# Patient Record
Sex: Male | Born: 1990
Health system: Southern US, Community
[De-identification: ages and names within clinical notes are randomized; demographics above are authoritative.]

## PROBLEM LIST (undated history)

## (undated) HISTORY — PX: HIP SURGERY: SHX245

## (undated) HISTORY — PX: SHOULDER OPEN ROTATOR CUFF REPAIR: SHX2407

## (undated) HISTORY — PX: RECONSTRUCTION THUMB, ULNAR COLLATERAL LIGAMENT: SHX510877

## (undated) HISTORY — PX: KNEE SURGERY: SHX244

---

## 2012-02-25 ENCOUNTER — Encounter (HOSPITAL_COMMUNITY): Payer: Self-pay | Admitting: Emergency Medicine

## 2012-02-25 ENCOUNTER — Emergency Department (HOSPITAL_COMMUNITY)
Admission: EM | Admit: 2012-02-25 | Discharge: 2012-02-25 | Disposition: A | Discharge status: Home / Self Care | Payer: BC Managed Care – PPO | Attending: Emergency Medicine | Admitting: Emergency Medicine

## 2012-02-25 ENCOUNTER — Emergency Department (HOSPITAL_COMMUNITY): Payer: BC Managed Care – PPO

## 2012-02-25 DIAGNOSIS — F17200 Nicotine dependence, unspecified, uncomplicated: Secondary | ICD-10-CM | POA: Insufficient documentation

## 2012-02-25 DIAGNOSIS — R1013 Epigastric pain: Secondary | ICD-10-CM | POA: Insufficient documentation

## 2012-02-25 DIAGNOSIS — R109 Unspecified abdominal pain: Secondary | ICD-10-CM

## 2012-02-25 LAB — LIPASE, BLOOD: Lipase: 29 U/L (ref 11–59)

## 2012-02-25 LAB — COMPREHENSIVE METABOLIC PANEL
ALT: 24 U/L (ref 0–53)
AST: 19 U/L (ref 0–37)
Albumin: 4.7 g/dL (ref 3.5–5.2)
Alkaline Phosphatase: 65 U/L (ref 39–117)
BUN: 13 mg/dL (ref 6–23)
CO2: 25 mEq/L (ref 19–32)
Calcium: 10.3 mg/dL (ref 8.4–10.5)
Chloride: 101 mEq/L (ref 96–112)
Creatinine, Ser: 0.93 mg/dL (ref 0.50–1.35)
GFR calc Af Amer: >90 mL/min (ref >90)
GFR calc non Af Amer: >90 mL/min (ref >90)
Glucose, Bld: 92 mg/dL (ref 70–99)
Potassium: 4.1 mEq/L (ref 3.5–5.1)
Sodium: 139 mEq/L (ref 135–145)
Total Bilirubin: 0.3 mg/dL (ref 0.3–1.2)
Total Protein: 7.8 g/dL (ref 6.0–8.3)

## 2012-02-25 LAB — CBC WITH DIFFERENTIAL/PLATELET
Basophils Absolute: 0 10*3/uL (ref 0.0–0.1)
Basophils Relative: 0 % (ref 0–1)
Eosinophils Absolute: 0.3 10*3/uL (ref 0.0–0.7)
Eosinophils Relative: 3 % (ref 0–5)
HCT: 44.6 % (ref 39.0–52.0)
Hemoglobin: 15.7 g/dL (ref 13.0–17.0)
Lymphocytes Relative: 25 % (ref 12–46)
Lymphs Abs: 2.2 10*3/uL (ref 0.7–4.0)
MCH: 28.9 pg (ref 26.0–34.0)
MCHC: 35.2 g/dL (ref 30.0–36.0)
MCV: 82.1 fL (ref 78.0–100.0)
Monocytes Absolute: 0.7 10*3/uL (ref 0.1–1.0)
Monocytes Relative: 8 % (ref 3–12)
Neutro Abs: 5.5 10*3/uL (ref 1.7–7.7)
Neutrophils Relative %: 63 % (ref 43–77)
Platelets: 253 10*3/uL (ref 150–400)
RBC: 5.43 MIL/uL (ref 4.22–5.81)
RDW: 12.1 % (ref 11.5–15.5)
WBC: 8.7 10*3/uL (ref 4.0–10.5)

## 2012-02-25 MED ORDER — OMEPRAZOLE 20 MG PO CPDR: 40.0000 mg | DELAYED_RELEASE_CAPSULE | Freq: Every day | ORAL | Status: DC

## 2012-02-25 NOTE — ED Notes (Signed)
Pt c/o epigastric pain intermittent x 1 week; pt sts sometimes worse after eating; pt sts some nausea

## 2012-02-25 NOTE — ED Provider Notes (Signed)
History     CSN: [id number]  Arrival date & time 02/25/12  1039   First MD Initiated Contact with Patient 02/25/12 1106      Chief Complaint  Patient presents with  . Abdominal Pain    (Consider location/radiation/quality/duration/timing/severity/associated sxs/prior treatment) The history is provided by the patient.   patient presents with few day history of abdominal pain. It is epigastric. It is constant, but has some episodes of severity. He states this happened since she was sledding and hit his rear-ended broke his coccyx. He states his been on Vicodin for her. He states no relief with TUMS or Prilosec. It is worse after eating. He had a few alcoholic drinks recently but not too many. He's not had pains at this before. No blood in stool. No diarrhea. Cough. No fevers. No weight loss. History reviewed. No pertinent past medical history.  History reviewed. No pertinent past surgical history.  History reviewed. No pertinent family history.  History  Substance Use Topics  . Smoking status: Current Every Day Smoker  . Smokeless tobacco: Not on file  . Alcohol Use: No      Review of Systems  Constitutional: Negative for activity change and appetite change.  HENT: Negative for neck stiffness.   Eyes: Negative for pain.  Respiratory: Negative for chest tightness and shortness of breath.   Cardiovascular: Negative for chest pain and leg swelling.  Gastrointestinal: Positive for abdominal pain. Negative for nausea, vomiting and diarrhea.  Genitourinary: Negative for flank pain.  Musculoskeletal: Negative for back pain.  Skin: Negative for rash.  Neurological: Negative for weakness, numbness and headaches.  Psychiatric/Behavioral: Negative for behavioral problems.    Allergies  Review of patient's allergies indicates no known allergies.  Home Medications   Current Outpatient Rx  Name  Route  Sig  Dispense  Refill  . calcium carbonate (TUMS EX) 750 MG chewable tablet  Oral   Chew 2 tablets by mouth 3 (three) times daily as needed for heartburn.         Marland Kitchen omeprazole (PRILOSEC) 20 MG capsule   Oral   Take 2 capsules (40 mg total) by mouth daily.   14 capsule   0     BP 125/42  Pulse 80  Temp(Src) 98 F (36.7 C) (Oral)  Resp 20  SpO2 100%  Physical Exam  Nursing note and vitals reviewed. Constitutional: He is oriented to person, place, and time. He appears well-developed and well-nourished.  HENT:  Head: Normocephalic and atraumatic.  Eyes: EOM are normal. Pupils are equal, round, and reactive to light.  Neck: Normal range of motion. Neck supple.  Cardiovascular: Normal rate, regular rhythm and normal heart sounds.   No murmur heard. Pulmonary/Chest: Effort normal and breath sounds normal.  Abdominal: Soft. Bowel sounds are normal. He exhibits no distension and no mass. There is tenderness. There is no rebound and no guarding.  Epigastric tenderness without rebound or guarding. No rash. No hernias.  Musculoskeletal: Normal range of motion. He exhibits no edema.  Neurological: He is alert and oriented to person, place, and time. No cranial nerve deficit.  Skin: Skin is warm and dry.  Psychiatric: He has a normal mood and affect.    ED Course  Procedures (including critical care time)  Labs Reviewed  CBC WITH DIFFERENTIAL  COMPREHENSIVE METABOLIC PANEL  LIPASE, BLOOD   US Abdomen Complete  02/25/2012  *RADIOLOGY REPORT*  Clinical Data:  Epigastric abdominal pain  COMPLETE ABDOMINAL ULTRASOUND  Comparison:  None.  Findings:  Gallbladder:  Sonographically normal.  No echogenic gallstones or gall sludge.  No gallbladder wall thickening or pericholecystic fluid.  Negative sonographic Murphy's sign.  Common bile duct:  Normal in size measuring 2.4 mm in diameter  Liver:  Homogeneous hepatic echotexture.  No discrete hepatic lesions.  No definite evidence of intrahepatic biliary ductal dilatation.  No ascites.  IVC:  Appears normal.  Pancreas:   Limited visualization of the pancreatic head and neck is normal.  Visualization of the pancreatic body and tail is obscured by bowel gas.  Spleen:  Normal in size measuring 10.1 cm in length.  Right Kidney:  Normal cortical thickness, echogenicity and size, measuring 12.4 cm in length.  No focal renal lesions.  No echogenic renal stones.  No urinary obstruction.  Left Kidney:  Normal cortical thickness, echogenicity and size, measuring 13.0 cm in length.  No focal renal lesions.  No echogenic renal stones.  No urinary obstruction.  Abdominal aorta:  No aneurysm identified.  IMPRESSION: No explanation for patient's epigastric pain.  Specifically, no evidence of cholelithiasis or urinary obstruction.   Original Report Authenticated By: Tacey Ruiz, MD      1. Abdominal pain       MDM  Patient presents with upper abdominal pain. Worse with eating. Been going for a few days. Began after trauma, however he states is not his abdomen. He has minimal tenderness. Liver is reassuring. Patient be discharged home with proton pump inhibitor and will follow with gastroenterology.        Juliet Rude. Rubin Payor, MD 02/25/12 1454

## 2012-02-25 NOTE — ED Notes (Signed)
Pt returned to room from ultrasound, no distress noted. 

## 2012-07-22 ENCOUNTER — Encounter (HOSPITAL_BASED_OUTPATIENT_CLINIC_OR_DEPARTMENT_OTHER): Payer: Self-pay | Admitting: *Deleted

## 2012-07-22 ENCOUNTER — Ambulatory Visit (HOSPITAL_BASED_OUTPATIENT_CLINIC_OR_DEPARTMENT_OTHER)
Admission: RE | Admit: 2012-07-22 | Discharge: 2012-07-22 | Disposition: A | Discharge status: Home / Self Care | Payer: Worker's Compensation | Source: Ambulatory Visit | Attending: Orthopedic Surgery | Admitting: Orthopedic Surgery

## 2012-07-22 ENCOUNTER — Ambulatory Visit (HOSPITAL_BASED_OUTPATIENT_CLINIC_OR_DEPARTMENT_OTHER): Payer: Worker's Compensation | Admitting: Anesthesiology

## 2012-07-22 ENCOUNTER — Encounter (HOSPITAL_BASED_OUTPATIENT_CLINIC_OR_DEPARTMENT_OTHER): Payer: Self-pay | Admitting: Anesthesiology

## 2012-07-22 ENCOUNTER — Encounter (HOSPITAL_BASED_OUTPATIENT_CLINIC_OR_DEPARTMENT_OTHER)
Admission: RE | Disposition: A | Discharge status: Home / Self Care | Payer: Self-pay | Source: Ambulatory Visit | Attending: Orthopedic Surgery

## 2012-07-22 DIAGNOSIS — Y9269 Other specified industrial and construction area as the place of occurrence of the external cause: Secondary | ICD-10-CM | POA: Insufficient documentation

## 2012-07-22 DIAGNOSIS — F17200 Nicotine dependence, unspecified, uncomplicated: Secondary | ICD-10-CM | POA: Insufficient documentation

## 2012-07-22 DIAGNOSIS — S61409A Unspecified open wound of unspecified hand, initial encounter: Secondary | ICD-10-CM | POA: Insufficient documentation

## 2012-07-22 DIAGNOSIS — X58XXXA Exposure to other specified factors, initial encounter: Secondary | ICD-10-CM | POA: Insufficient documentation

## 2012-07-22 DIAGNOSIS — Z79899 Other long term (current) drug therapy: Secondary | ICD-10-CM | POA: Insufficient documentation

## 2012-07-22 DIAGNOSIS — Y990 Civilian activity done for income or pay: Secondary | ICD-10-CM | POA: Insufficient documentation

## 2012-07-22 HISTORY — PX: INCISION AND DRAINAGE ABSCESS: SHX5864

## 2012-07-22 SURGERY — INCISION AND DRAINAGE, ABSCESS
Anesthesia: General | Site: Hand | Laterality: Left | Wound class: Dirty or Infected

## 2012-07-22 MED ORDER — OXYCODONE HCL 5 MG/5ML PO SOLN: 5.0000 mg | Freq: Once | ORAL | Status: AC | PRN

## 2012-07-22 MED ORDER — MIDAZOLAM HCL 5 MG/5ML IJ SOLN
INTRAMUSCULAR | Status: DC | PRN
  Administered 2012-07-22: 2 mg via INTRAVENOUS

## 2012-07-22 MED ORDER — FENTANYL CITRATE 0.05 MG/ML IJ SOLN
INTRAMUSCULAR | Status: DC | PRN
  Administered 2012-07-22: 100 ug via INTRAVENOUS

## 2012-07-22 MED ORDER — ONDANSETRON HCL 4 MG/2ML IJ SOLN
INTRAMUSCULAR | Status: DC | PRN
  Administered 2012-07-22: 4 mg via INTRAVENOUS

## 2012-07-22 MED ORDER — BUPIVACAINE HCL (PF) 0.25 % IJ SOLN
INTRAMUSCULAR | Status: DC | PRN
  Administered 2012-07-22: 5 mL

## 2012-07-22 MED ORDER — DEXAMETHASONE SODIUM PHOSPHATE 4 MG/ML IJ SOLN
INTRAMUSCULAR | Status: DC | PRN
  Administered 2012-07-22: 10 mg via INTRAVENOUS

## 2012-07-22 MED ORDER — LACTATED RINGERS IV SOLN
INTRAVENOUS | Status: DC
  Administered 2012-07-22: 12:00:00 via INTRAVENOUS

## 2012-07-22 MED ORDER — PROPOFOL 10 MG/ML IV BOLUS
INTRAVENOUS | Status: DC | PRN
  Administered 2012-07-22: 250 mg via INTRAVENOUS

## 2012-07-22 MED ORDER — MIDAZOLAM HCL 2 MG/2ML IJ SOLN: 1.0000 mg | INTRAMUSCULAR | Status: DC | PRN

## 2012-07-22 MED ORDER — HYDROMORPHONE HCL PF 1 MG/ML IJ SOLN
0.2500 mg | INTRAMUSCULAR | Status: DC | PRN
  Administered 2012-07-22 (×3): 0.5 mg via INTRAVENOUS

## 2012-07-22 MED ORDER — LIDOCAINE HCL (CARDIAC) 20 MG/ML IV SOLN
INTRAVENOUS | Status: DC | PRN
  Administered 2012-07-22: 80 mg via INTRAVENOUS

## 2012-07-22 MED ORDER — FENTANYL CITRATE 0.05 MG/ML IJ SOLN: 50.0000 ug | Freq: Once | INTRAMUSCULAR | Status: DC

## 2012-07-22 MED ORDER — OXYCODONE-ACETAMINOPHEN 5-325 MG PO TABS: ORAL_TABLET | ORAL | Status: DC

## 2012-07-22 MED ORDER — OXYCODONE HCL 5 MG PO TABS
5.0000 mg | ORAL_TABLET | Freq: Once | ORAL | Status: AC | PRN
  Administered 2012-07-22: 5 mg via ORAL

## 2012-07-22 MED ORDER — SULFAMETHOXAZOLE-TRIMETHOPRIM 800-160 MG PO TABS: 1.0000 | ORAL_TABLET | Freq: Two times a day (BID) | ORAL | Status: DC

## 2012-07-22 SURGICAL SUPPLY — 50 items
BAG DECANTER FOR FLEXI CONT (MISCELLANEOUS) IMPLANT
BANDAGE COBAN STERILE 2 (GAUZE/BANDAGES/DRESSINGS) ×2 IMPLANT
BANDAGE ELASTIC 3 VELCRO ST LF (GAUZE/BANDAGES/DRESSINGS) ×2 IMPLANT
BANDAGE GAUZE ELAST BULKY 4 IN (GAUZE/BANDAGES/DRESSINGS) ×2 IMPLANT
BANDAGE GAUZE STRT 1 STR LF (GAUZE/BANDAGES/DRESSINGS) IMPLANT
BLADE MINI RND TIP GREEN BEAV (BLADE) IMPLANT
BLADE SURG 15 STRL LF DISP TIS (BLADE) ×2 IMPLANT
BLADE SURG 15 STRL SS (BLADE) ×2
BNDG COHESIVE 1X5 TAN STRL LF (GAUZE/BANDAGES/DRESSINGS) IMPLANT
BNDG ELASTIC 2 VLCR STRL LF (GAUZE/BANDAGES/DRESSINGS) IMPLANT
BNDG ESMARK 4X9 LF (GAUZE/BANDAGES/DRESSINGS) ×2 IMPLANT
CHLORAPREP W/TINT 26ML (MISCELLANEOUS) ×2 IMPLANT
CLOTH BEACON ORANGE TIMEOUT ST (SAFETY) ×2 IMPLANT
CORDS BIPOLAR (ELECTRODE) IMPLANT
COVER MAYO STAND STRL (DRAPES) ×2 IMPLANT
COVER TABLE BACK 60X90 (DRAPES) ×2 IMPLANT
CUFF TOURNIQUET SINGLE 18IN (TOURNIQUET CUFF) ×2 IMPLANT
DRAPE EXTREMITY T 121X128X90 (DRAPE) ×2 IMPLANT
DRAPE SURG 17X23 STRL (DRAPES) ×2 IMPLANT
GAUZE PACKING IODOFORM 1/4X5 (PACKING) IMPLANT
GAUZE XEROFORM 1X8 LF (GAUZE/BANDAGES/DRESSINGS) ×2 IMPLANT
GLOVE BIO SURGEON STRL SZ 6.5 (GLOVE) ×2 IMPLANT
GLOVE BIO SURGEON STRL SZ7.5 (GLOVE) ×2 IMPLANT
GLOVE BIOGEL PI IND STRL 8 (GLOVE) ×1 IMPLANT
GLOVE BIOGEL PI IND STRL 8.5 (GLOVE) ×1 IMPLANT
GLOVE BIOGEL PI INDICATOR 8 (GLOVE) ×1
GLOVE BIOGEL PI INDICATOR 8.5 (GLOVE) ×1
GLOVE SURG ORTHO 8.0 STRL STRW (GLOVE) ×2 IMPLANT
GOWN BRE IMP PREV XXLGXLNG (GOWN DISPOSABLE) ×2 IMPLANT
GOWN PREVENTION PLUS XLARGE (GOWN DISPOSABLE) ×2 IMPLANT
LOOP VESSEL MAXI BLUE (MISCELLANEOUS) IMPLANT
NEEDLE HYPO 25X1 1.5 SAFETY (NEEDLE) ×2 IMPLANT
NS IRRIG 1000ML POUR BTL (IV SOLUTION) ×2 IMPLANT
PACK BASIN DAY SURGERY FS (CUSTOM PROCEDURE TRAY) ×2 IMPLANT
PAD CAST 3X4 CTTN HI CHSV (CAST SUPPLIES) IMPLANT
PADDING CAST ABS 4INX4YD NS (CAST SUPPLIES)
PADDING CAST ABS COTTON 4X4 ST (CAST SUPPLIES) IMPLANT
PADDING CAST COTTON 3X4 STRL (CAST SUPPLIES)
SPLINT PLASTER CAST XFAST 3X15 (CAST SUPPLIES) IMPLANT
SPLINT PLASTER XTRA FASTSET 3X (CAST SUPPLIES)
SPONGE GAUZE 4X4 12PLY (GAUZE/BANDAGES/DRESSINGS) ×2 IMPLANT
STOCKINETTE 4X48 STRL (DRAPES) ×2 IMPLANT
SUT ETHILON 4 0 PS 2 18 (SUTURE) IMPLANT
SWAB COLLECTION DEVICE MRSA (MISCELLANEOUS) ×2 IMPLANT
SYR BULB 3OZ (MISCELLANEOUS) ×2 IMPLANT
SYR CONTROL 10ML LL (SYRINGE) ×2 IMPLANT
TOWEL OR 17X24 6PK STRL BLUE (TOWEL DISPOSABLE) ×4 IMPLANT
TUBE ANAEROBIC SPECIMEN COL (MISCELLANEOUS) ×2 IMPLANT
TUBE FEEDING 5FR 15 INCH (TUBING) IMPLANT
UNDERPAD 30X30 INCONTINENT (UNDERPADS AND DIAPERS) ×2 IMPLANT

## 2012-07-22 NOTE — Transfer of Care (Signed)
Immediate Anesthesia Transfer of Care Note  Patient: Tyler King  Procedure(s) Performed: Procedure(s): INCISION AND DRAINAGE ABSCESS  Foreign body removal  (Left)  Patient Location: PACU  Anesthesia Type:General  Level of Consciousness: awake, alert  and oriented  Airway & Oxygen Therapy: Patient Spontanous Breathing and Patient connected to face mask oxygen  Post-op Assessment: Report given to PACU RN and Post -op Vital signs reviewed and stable  Post vital signs: Reviewed and stable  Complications: No apparent anesthesia complications

## 2012-07-22 NOTE — Anesthesia Preprocedure Evaluation (Signed)
Anesthesia Evaluation  Patient identified by MRN, date of birth, ID band Patient awake    Reviewed: Allergy & Precautions, H&P , NPO status , Patient's Chart, lab work & pertinent test results  Airway Mallampati: I TM Distance: >3 FB Neck ROM: Full    Dental   Pulmonary Current Smoker,  breath sounds clear to auscultation        Cardiovascular Rhythm:Regular Rate:Normal     Neuro/Psych    GI/Hepatic   Endo/Other    Renal/GU      Musculoskeletal   Abdominal   Peds  Hematology   Anesthesia Other Findings   Reproductive/Obstetrics                           Anesthesia Physical Anesthesia Plan  ASA: II  Anesthesia Plan: General   Post-op Pain Management:    Induction: Intravenous  Airway Management Planned: LMA  Additional Equipment:   Intra-op Plan:   Post-operative Plan: Extubation in OR  Informed Consent: I have reviewed the patients History and Physical, chart, labs and discussed the procedure including the risks, benefits and alternatives for the proposed anesthesia with the patient or authorized representative who has indicated his/her understanding and acceptance.     Plan Discussed with: CRNA and Surgeon  Anesthesia Plan Comments:         Anesthesia Quick Evaluation

## 2012-07-22 NOTE — Anesthesia Postprocedure Evaluation (Signed)
Anesthesia Post Note  Patient: Tyler King  Procedure(s) Performed: Procedure(s) (LRB): INCISION AND DRAINAGE ABSCESS  Foreign body removal  (Left)  Anesthesia type: general  Patient location: PACU  Post pain: Pain level controlled  Post assessment: Patient's Cardiovascular Status Stable  Last Vitals:  Filed Vitals:   07/22/12 1526  BP:   Pulse: 83  Temp:   Resp: 20    Post vital signs: Reviewed and stable  Level of consciousness: sedated  Complications: No apparent anesthesia complications

## 2012-07-22 NOTE — H&P (Signed)
Tyler King is an 22 y.o. male.   Chief Complaint: left hand foreign body and infection HPI: 22 yo rhd male states 2 weeks ago while at work a piece of plywood was being lifted out of a hole and a splinter of wood went into his left hand.  Seen at ED and I&D and attempted foreign body removal performed.  Started on antibiotics 1 week ago by urgent care.  Returned to urgent care today with swelling and pain of area around wound.  No fevers, chills, night sweats.  Reports no previous injury to hand and no other injury at this time.  History reviewed. No pertinent past medical history.  History reviewed. No pertinent past surgical history.  History reviewed. No pertinent family history. Social History:  reports that he has been smoking Cigarettes.  He has a 2 pack-year smoking history. He does not have any smokeless tobacco history on file. He reports that he does not drink alcohol or use illicit drugs.  Allergies: No Known Allergies  Medications Prior to Admission  Medication Sig Dispense Refill  . amoxicillin-clavulanate (AUGMENTIN) 500-125 MG per tablet Take 1 tablet by mouth 3 (three) times daily.      Marland Kitchen oxyCODONE-acetaminophen (PERCOCET/ROXICET) 5-325 MG per tablet Take 1 tablet by mouth every 4 (four) hours as needed for pain.      . calcium carbonate (TUMS EX) 750 MG chewable tablet Chew 2 tablets by mouth 3 (three) times daily as needed for heartburn.      Marland Kitchen omeprazole (PRILOSEC) 20 MG capsule Take 2 capsules (40 mg total) by mouth daily.  14 capsule  0    No results found for this or any previous visit (from the past 48 hour(s)).  No results found.   A comprehensive review of systems was negative.  Blood pressure 144/86, pulse 84, temperature 98.3 F (36.8 C), temperature source Oral, resp. rate 20, height 6\' 3"  (1.905 m), weight 257 lb (116.574 kg), SpO2 97.00%.  General appearance: alert, cooperative and appears stated age Head: Normocephalic, without obvious abnormality,  atraumatic Neck: supple, symmetrical, trachea midline Resp: clear to auscultation bilaterally Cardio: regular rate and rhythm GI: non tender Extremities: intact capillary refill all digits.  decreased sensation on radial side of left index finger.  normal sensation in fingertips otherwise.  +epl/fpl/io.  wound at volar mp joint of index on left hand.  tender around wound and just distal to it.  no tenderness in digits.  able to flex and extend index finger limited by swelling not pain.  no proximal streaks. Pulses: 2+ and symmetric Skin: as above Neurologic: Grossly normal Incision/Wound: As above  Assessment/Plan Left hand infected foreign body with possible radial digital nerve injury to index finger.  Non operative and operative treatment options were discussed with the patient and patient wishes to proceed with operative treatment. Recommend OR for I&D and exploration of wound.  Risks, benefits, and alternatives of surgery were discussed and the patient agrees with the plan of care.   Manasi Dishon R 07/22/2012, 12:40 PM

## 2012-07-22 NOTE — Op Note (Signed)
[id number] 

## 2012-07-22 NOTE — Anesthesia Procedure Notes (Signed)
Procedure Name: LMA Insertion Performed by: Katrisha Segall W Pre-anesthesia Checklist: Patient identified, Timeout performed, Emergency Drugs available, Suction available and Patient being monitored Patient Re-evaluated:Patient Re-evaluated prior to inductionOxygen Delivery Method: Circle system utilized Preoxygenation: Pre-oxygenation with 100% oxygen Intubation Type: IV induction Ventilation: Mask ventilation without difficulty LMA: LMA with gastric port inserted LMA Size: 4.0 Number of attempts: 1 Placement Confirmation: breath sounds checked- equal and bilateral and positive ETCO2 Tube secured with: Tape Dental Injury: Teeth and Oropharynx as per pre-operative assessment      

## 2012-07-22 NOTE — Brief Op Note (Signed)
07/22/2012  2:26 PM  PATIENT:  Tyler King  22 y.o. male  PRE-OPERATIVE DIAGNOSIS:  foreign body in left palm   POST-OPERATIVE DIAGNOSIS:  foreign body left palm  PROCEDURE:  Procedure(s): INCISION AND DRAINAGE ABSCESS  Foreign body removal   SURGEON:  Surgeon(s): Tami Ribas, MD Nicki Reaper, MD  PHYSICIAN ASSISTANT:   ASSISTANTS: Cindee Salt, MD   ANESTHESIA:   general  EBL:  Total I/O In: 1000 [I.V.:1000] Out: -   DRAINS: iodoform packing  LOCAL MEDICATIONS USED:  MARCAINE     SPECIMEN:  Source of Specimen:  left hand  DISPOSITION OF SPECIMEN:  micro  COUNTS:  YES  TOURNIQUET:   Total Tourniquet Time Documented: Upper Arm (Left) - 17 minutes Total: Upper Arm (Left) - 17 minutes   DICTATION: .Other Dictation: Dictation Number [id number]  PLAN OF CARE: Discharge to home after PACU

## 2012-07-23 ENCOUNTER — Encounter (HOSPITAL_BASED_OUTPATIENT_CLINIC_OR_DEPARTMENT_OTHER): Payer: Self-pay | Admitting: Orthopedic Surgery

## 2012-07-23 LAB — POCT HEMOGLOBIN-HEMACUE: Hemoglobin: 14.6 g/dL (ref 13.0–17.0)

## 2012-07-23 NOTE — Op Note (Signed)
NAMEMarland Kitchen  TALIB, Tyler NO.:  [id number]  MEDICAL RECORD NO.:  [id number]  LOCATION:                               FACILITY:  [facility]  PHYSICIAN:  Tyler Loa, MD        DATE OF BIRTH:  06/13/1990  DATE OF PROCEDURE:  07/22/2012 DATE OF DISCHARGE:  07/22/2012                              OPERATIVE REPORT   PREOPERATIVE DIAGNOSIS:  Left hand retained foreign body with infection.  POSTOPERATIVE DIAGNOSIS:  Left hand retained foreign body with infection.  PROCEDURE:  I and D left hand abscess and removal of foreign body.  SURGEON:  Tyler Loa, MD  ASSISTANT:  Tyler Salt, MD  ANESTHESIA:  General.  IV FLUIDS:  Per anesthesia flow sheet.  ESTIMATED BLOOD LOSS:  Minimal.  COMPLICATIONS:  None.  SPECIMENS:  Cultures for aerobes, anaerobes, AFB, fungus to micro. Splinter removed and given to family.  TOURNIQUET TIME:  17 minutes.  DISPOSITION:  Stable to PACU.  INDICATIONS:  Tyler King is a 22 year old right-hand dominant male, who states approximately 2 weeks ago while at work, a piece of plywood caused the splinter into his left hand.  He was seen at the emergency department where attempts were made to explore the wound and remove foreign body.  He was started on antibiotics last week.  He returned to Urgent Care today with increased swelling and pain in the hand.  He is referred to me for further care.  He reports no fevers, chills, or night sweats.  On examination, he had intact sensation and capillary refill in all fingertips with the exception of the radial-sided index finger where the sensation was slightly decreased.  He had a wound at the volar aspect of the MP joint of the index finger with surrounding erythema.  There was no proximal streaking.  He was tender in this area and distal to the wound.  I discussed with Tyler King, the nature of the condition.  I recommended going to the operating room for irrigation and debridement of the wound,  attempts to remove the foreign body and exploration of the wound for potential nerve injury.  Risks, benefits, and alternatives of surgery were discussed including the risk of blood loss, infection, damage to nerves, vessels, tendons, ligaments, bone; failure of surgery; need for additional surgery, complications with wound healing, continued pain, continued infection, retained foreign body.  He voiced understanding of these risks and elected to proceed.  OPERATIVE COURSE:  After being identified preoperatively by myself, the patient and I agreed upon procedure and site of procedure.  Surgical site was marked.  The risks, benefits, and alternatives of surgery were reviewed and he wished to proceed.  Surgical consent had been signed. He was transferred to the operating room, placed on the operating room table in supine position with left upper extremity on arm board. General anesthesia was induced by anesthesiologist.  Left upper extremity was prepped and draped in normal sterile orthopedic fashion. A surgical pause was performed between surgeons, anesthesia, operating staff, and all were in agreement as to the patient, procedure, and site of procedure.  Tourniquet at the proximal aspect of the extremity was inflated to 250  mmHg after Esmarch exsanguination of the forearm. Incision was made extending the wound slightly proximally and then distally.  This was done in a Waggaman fashion.  There was gross purulence.  Cultures were taken for aerobes, anaerobes, AFB, and fungus. The subcutaneous tissues were spread.  The wooden foreign body was identified and was removed.  It was approximately 2.5 cm in length.  The purulence tracked down toward the radial side of the index finger.  The wound was extended onto the proximal phalanx.  Again spreading technique was used.  It was felt that the whole of the abscess cavity was found. The purulence was debrided.  The rongeurs were used to  remove devitalized fat.  The radial digital nerve and artery to the index finger were identified and were traced through the zone of injury and were intact.  The traumatic portion of the wound at the skin was debrided using the scissors sharply.  The wound was copiously irrigated with sterile saline.  It was then packed with quarter-inch iodoform packing.  It was injected with 5 mL of 0.25% plain Marcaine to aid in postoperative analgesia.  It was then dressed with sterile Xeroform, 4x4s, and wrapped with a Kerlix and a Coban dressing lightly. Tourniquet was deflated at 17 minutes.  Fingertips were pink with brisk capillary refill after deflation of tourniquet.  Operative drapes were broken down and the patient was awoken from anesthesia safely.  He was transferred back to stretcher and taken to PACU in stable condition.  I will see him back in the office in 3 days for postoperative followup and initiation of hydrotherapy.  I will give him Percocet 5/325, 1-2 p.o. q.6 hours p.r.n. pain, dispensed #30 and Bactrim DS 1 p.o. b.i.d. x7 days.     Tyler Loa, MD     KK/MEDQ  D:  07/22/2012  T:  07/23/2012  Job:  [id number]

## 2012-07-26 LAB — CULTURE, ROUTINE-ABSCESS

## 2012-07-27 LAB — ANAEROBIC CULTURE

## 2013-09-19 IMAGING — US US ABDOMEN COMPLETE
1 series · 13 of 25 positions shown · non-contrast
Comparison: None.

CLINICAL DATA: Epigastric abdominal pain

COMPLETE ABDOMINAL ULTRASOUND

[Series 1: us abdomen complete · 0.34mm/px · 13 of 96 slices shown]
[im 1/96]
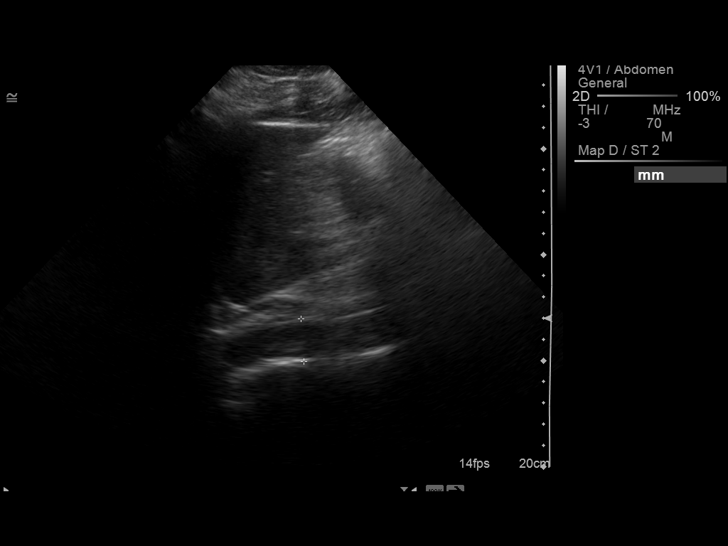
[im 8/96]
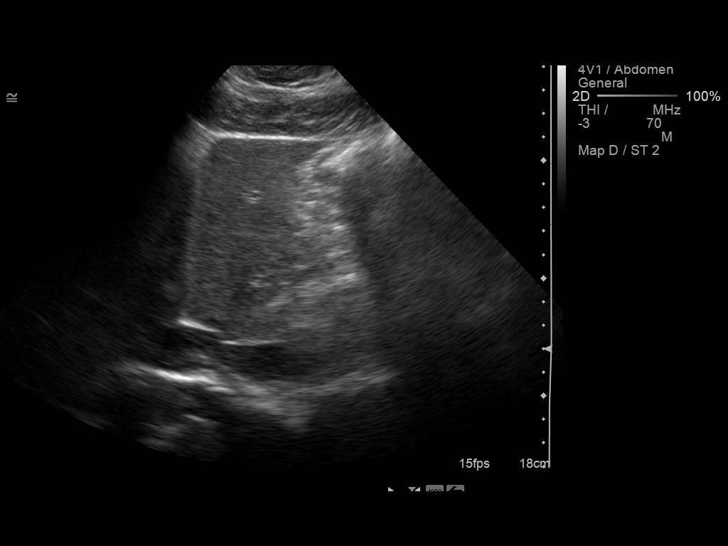
[im 16/96]
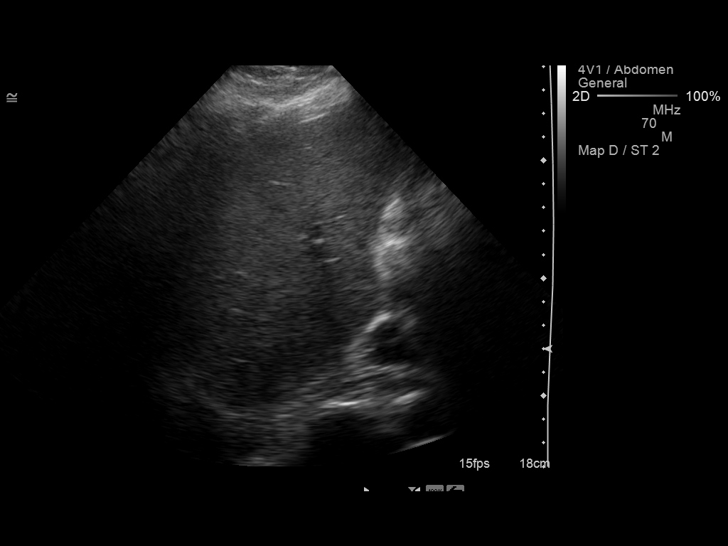
[im 24/96]
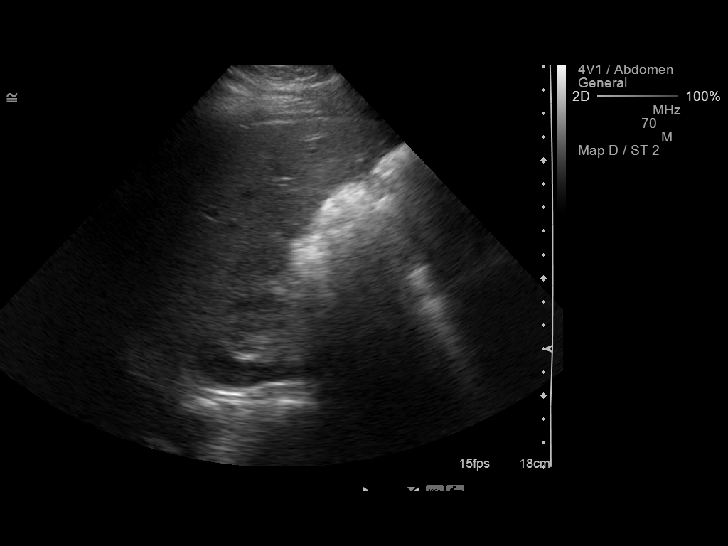
[im 32/96]
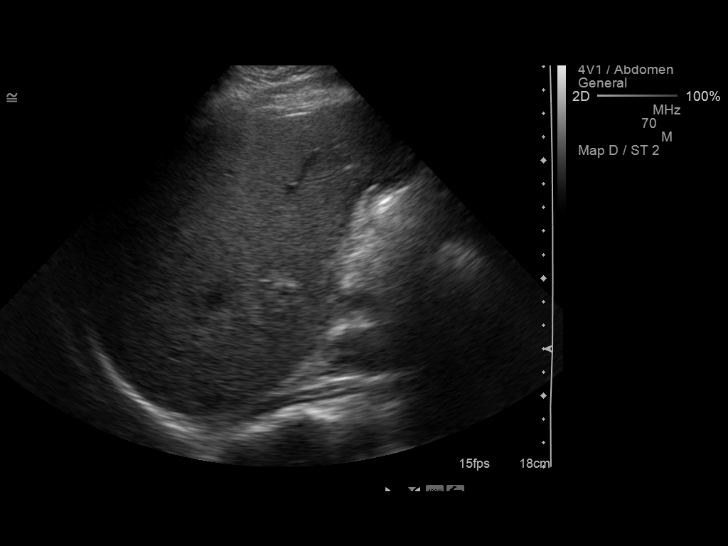
[im 40/96]
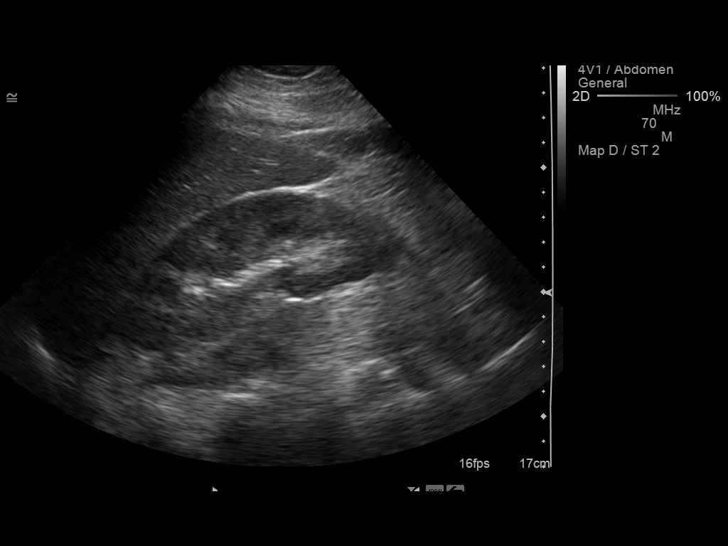
[im 48/96]
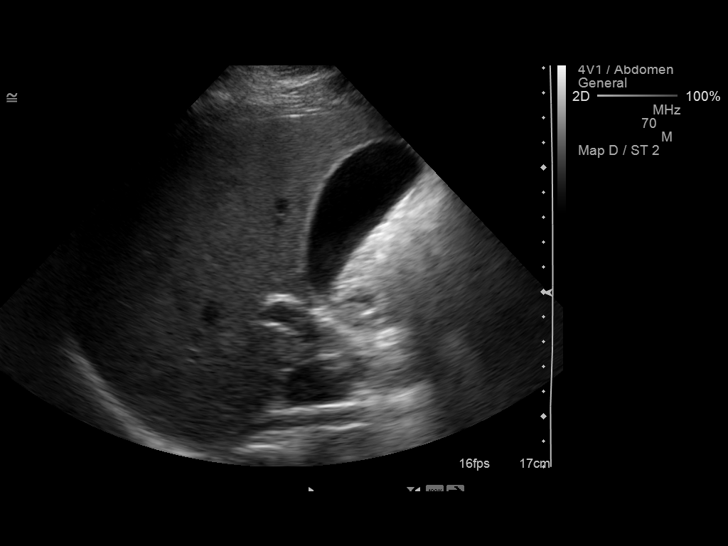
[im 56/96]
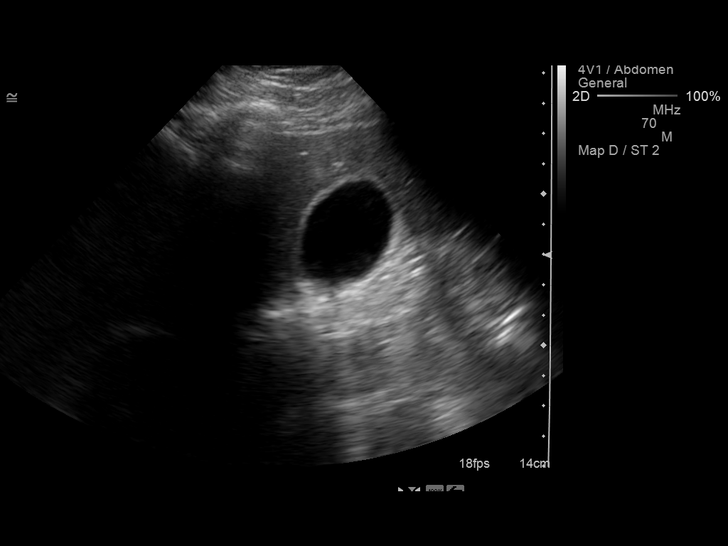
[im 64/96]
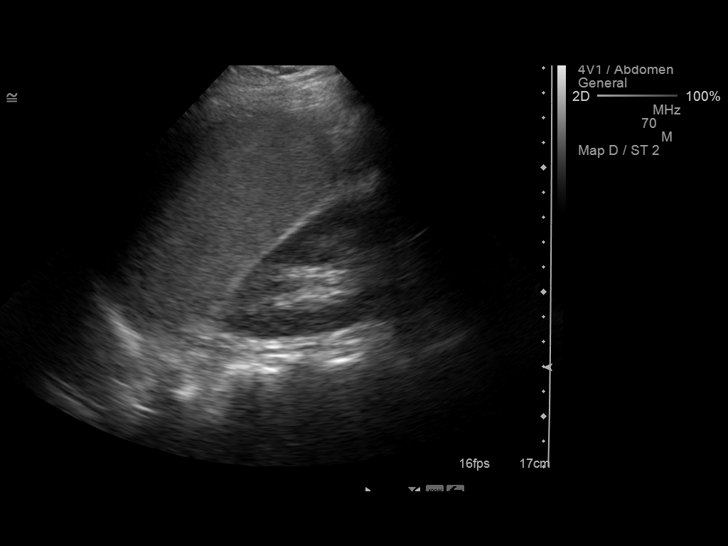
[im 72/96]
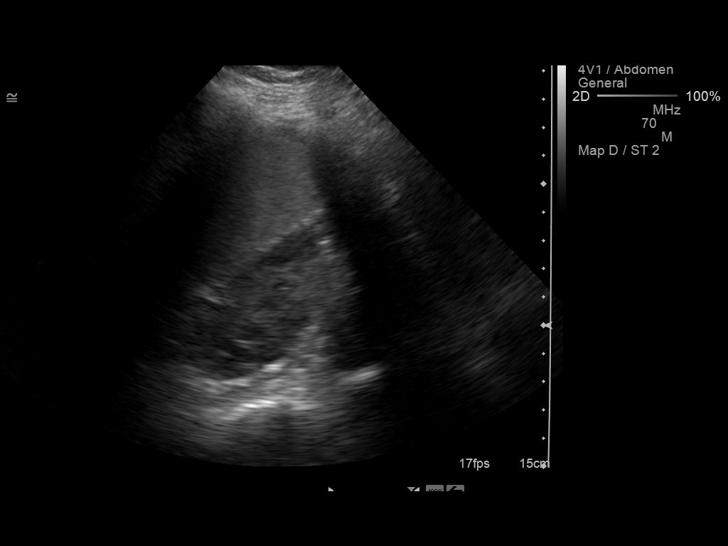
[im 80/96]
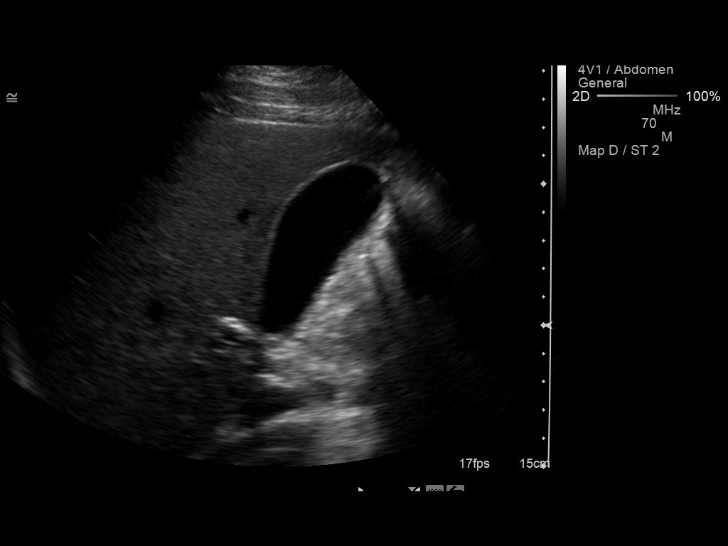
[im 88/96]
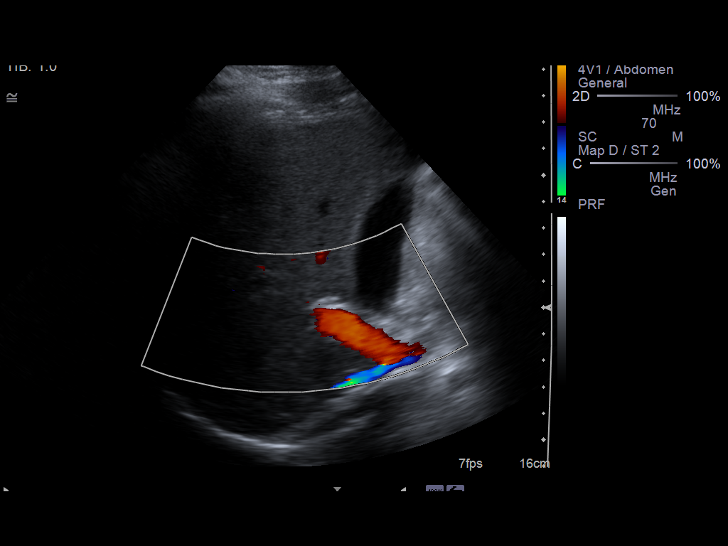
[im 96/96]
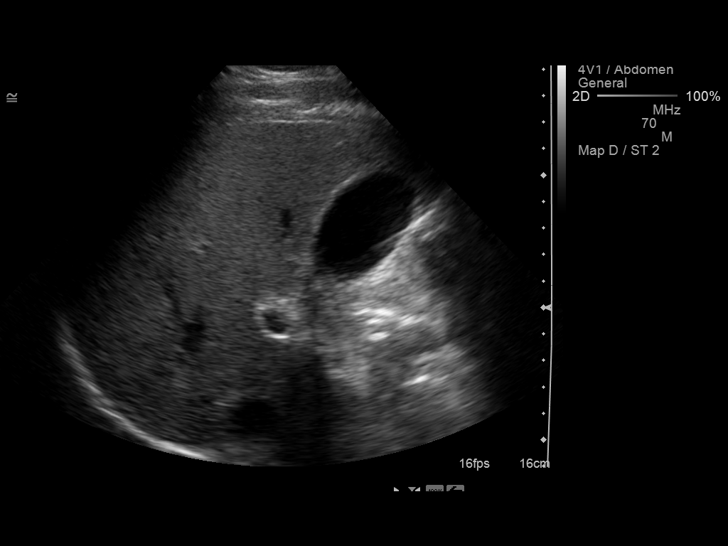

[13 of 25 positions shown; findings below may reference images not displayed]

FINDINGS: Gallbladder:  Sonographically normal.  No echogenic gallstones or
gall sludge.  No gallbladder wall thickening or pericholecystic
fluid.  Negative sonographic Murphy's sign.

Common bile duct:  Normal in size measuring 2.4 mm in diameter

Liver:  Homogeneous hepatic echotexture.  No discrete hepatic
lesions.  No definite evidence of intrahepatic biliary ductal
dilatation.  No ascites.

IVC:  Appears normal.

Pancreas:  Limited visualization of the pancreatic head and neck is
normal.  Visualization of the pancreatic body and tail is obscured
by bowel gas.

Spleen:  Normal in size measuring 10.1 cm in length.

Right Kidney:  Normal cortical thickness, echogenicity and size,
measuring 12.4 cm in length.  No focal renal lesions.  No echogenic
renal stones.  No urinary obstruction.

Left Kidney:  Normal cortical thickness, echogenicity and size,
measuring 13.0 cm in length.  No focal renal lesions.  No echogenic
renal stones.  No urinary obstruction.

Abdominal aorta:  No aneurysm identified.
IMPRESSION: No explanation for patient's epigastric pain.  Specifically, no
evidence of cholelithiasis or urinary obstruction.

## 2015-07-28 DIAGNOSIS — R1031 Right lower quadrant pain: Secondary | ICD-10-CM | POA: Diagnosis not present

## 2015-07-28 DIAGNOSIS — R1033 Periumbilical pain: Secondary | ICD-10-CM | POA: Diagnosis not present

## 2015-07-28 DIAGNOSIS — R110 Nausea: Secondary | ICD-10-CM | POA: Diagnosis not present

## 2015-07-28 DIAGNOSIS — R1032 Left lower quadrant pain: Secondary | ICD-10-CM | POA: Diagnosis not present

## 2015-07-29 DIAGNOSIS — R1031 Right lower quadrant pain: Secondary | ICD-10-CM | POA: Diagnosis not present

## 2016-02-14 DIAGNOSIS — Z79899 Other long term (current) drug therapy: Secondary | ICD-10-CM | POA: Diagnosis not present

## 2016-03-14 DIAGNOSIS — Z79899 Other long term (current) drug therapy: Secondary | ICD-10-CM | POA: Diagnosis not present

## 2016-04-12 DIAGNOSIS — Z79899 Other long term (current) drug therapy: Secondary | ICD-10-CM | POA: Diagnosis not present

## 2016-05-18 DIAGNOSIS — Z79899 Other long term (current) drug therapy: Secondary | ICD-10-CM | POA: Diagnosis not present

## 2016-06-15 DIAGNOSIS — Z79899 Other long term (current) drug therapy: Secondary | ICD-10-CM | POA: Diagnosis not present

## 2016-06-22 DIAGNOSIS — Z79899 Other long term (current) drug therapy: Secondary | ICD-10-CM | POA: Diagnosis not present

## 2016-07-20 DIAGNOSIS — Z79899 Other long term (current) drug therapy: Secondary | ICD-10-CM | POA: Diagnosis not present

## 2016-08-24 DIAGNOSIS — Z79899 Other long term (current) drug therapy: Secondary | ICD-10-CM | POA: Diagnosis not present

## 2016-09-26 DIAGNOSIS — Z79899 Other long term (current) drug therapy: Secondary | ICD-10-CM | POA: Diagnosis not present

## 2016-10-24 DIAGNOSIS — Z79899 Other long term (current) drug therapy: Secondary | ICD-10-CM | POA: Diagnosis not present

## 2016-11-26 DIAGNOSIS — Z79899 Other long term (current) drug therapy: Secondary | ICD-10-CM | POA: Diagnosis not present

## 2017-01-03 DIAGNOSIS — Z79899 Other long term (current) drug therapy: Secondary | ICD-10-CM | POA: Diagnosis not present

## 2017-02-06 DIAGNOSIS — Z79899 Other long term (current) drug therapy: Secondary | ICD-10-CM | POA: Diagnosis not present

## 2017-02-15 DIAGNOSIS — R7303 Prediabetes: Secondary | ICD-10-CM | POA: Diagnosis not present

## 2017-02-15 DIAGNOSIS — R5383 Other fatigue: Secondary | ICD-10-CM | POA: Diagnosis not present

## 2017-02-15 DIAGNOSIS — Z7689 Persons encountering health services in other specified circumstances: Secondary | ICD-10-CM | POA: Diagnosis not present

## 2017-02-15 DIAGNOSIS — R030 Elevated blood-pressure reading, without diagnosis of hypertension: Secondary | ICD-10-CM | POA: Diagnosis not present

## 2017-02-15 DIAGNOSIS — F411 Generalized anxiety disorder: Secondary | ICD-10-CM | POA: Diagnosis not present

## 2017-03-08 DIAGNOSIS — Z79899 Other long term (current) drug therapy: Secondary | ICD-10-CM | POA: Diagnosis not present

## 2017-04-19 DIAGNOSIS — Z79899 Other long term (current) drug therapy: Secondary | ICD-10-CM | POA: Diagnosis not present

## 2017-05-31 DIAGNOSIS — Z79899 Other long term (current) drug therapy: Secondary | ICD-10-CM | POA: Diagnosis not present

## 2017-08-01 DIAGNOSIS — T1502XA Foreign body in cornea, left eye, initial encounter: Secondary | ICD-10-CM | POA: Diagnosis not present

## 2018-06-27 DIAGNOSIS — M9901 Segmental and somatic dysfunction of cervical region: Secondary | ICD-10-CM | POA: Diagnosis not present

## 2018-06-27 DIAGNOSIS — M9902 Segmental and somatic dysfunction of thoracic region: Secondary | ICD-10-CM | POA: Diagnosis not present

## 2018-06-27 DIAGNOSIS — M542 Cervicalgia: Secondary | ICD-10-CM | POA: Diagnosis not present

## 2018-07-17 DIAGNOSIS — Z79899 Other long term (current) drug therapy: Secondary | ICD-10-CM | POA: Diagnosis not present

## 2018-12-04 DIAGNOSIS — J029 Acute pharyngitis, unspecified: Secondary | ICD-10-CM | POA: Diagnosis not present

## 2018-12-04 DIAGNOSIS — Z20828 Contact with and (suspected) exposure to other viral communicable diseases: Secondary | ICD-10-CM | POA: Diagnosis not present

## 2018-12-04 DIAGNOSIS — R519 Headache, unspecified: Secondary | ICD-10-CM | POA: Diagnosis not present

## 2019-02-26 DIAGNOSIS — Z79899 Other long term (current) drug therapy: Secondary | ICD-10-CM | POA: Diagnosis not present

## 2019-10-06 DIAGNOSIS — M7041 Prepatellar bursitis, right knee: Secondary | ICD-10-CM | POA: Diagnosis not present

## 2019-10-12 DIAGNOSIS — M7041 Prepatellar bursitis, right knee: Secondary | ICD-10-CM | POA: Diagnosis not present

## 2019-10-19 ENCOUNTER — Other Ambulatory Visit (HOSPITAL_COMMUNITY): Payer: Self-pay | Admitting: Orthopedic Surgery

## 2019-10-19 ENCOUNTER — Other Ambulatory Visit: Payer: Self-pay

## 2019-10-19 ENCOUNTER — Ambulatory Visit (INDEPENDENT_AMBULATORY_CARE_PROVIDER_SITE_OTHER)
Admission: RE | Admit: 2019-10-19 | Discharge: 2019-10-19 | Disposition: A | Discharge status: Home / Self Care | Payer: BC Managed Care – PPO | Source: Ambulatory Visit | Attending: Orthopedic Surgery | Admitting: Orthopedic Surgery

## 2019-10-19 DIAGNOSIS — Z20822 Contact with and (suspected) exposure to covid-19: Secondary | ICD-10-CM | POA: Diagnosis not present

## 2019-10-19 DIAGNOSIS — M71161 Other infective bursitis, right knee: Secondary | ICD-10-CM | POA: Diagnosis not present

## 2019-10-19 DIAGNOSIS — M7041 Prepatellar bursitis, right knee: Secondary | ICD-10-CM | POA: Diagnosis not present

## 2019-10-19 DIAGNOSIS — L03115 Cellulitis of right lower limb: Secondary | ICD-10-CM

## 2019-10-19 DIAGNOSIS — F17210 Nicotine dependence, cigarettes, uncomplicated: Secondary | ICD-10-CM | POA: Diagnosis not present

## 2019-10-19 DIAGNOSIS — M7989 Other specified soft tissue disorders: Secondary | ICD-10-CM | POA: Diagnosis not present

## 2019-10-19 DIAGNOSIS — M25561 Pain in right knee: Secondary | ICD-10-CM | POA: Diagnosis not present

## 2019-10-19 DIAGNOSIS — Z79899 Other long term (current) drug therapy: Secondary | ICD-10-CM | POA: Diagnosis not present

## 2019-10-19 DIAGNOSIS — M009 Pyogenic arthritis, unspecified: Secondary | ICD-10-CM | POA: Diagnosis not present

## 2019-10-20 ENCOUNTER — Inpatient Hospital Stay (HOSPITAL_COMMUNITY)
Admission: AD | Admit: 2019-10-20 | Discharge: 2019-10-22 | DRG: 501 | Disposition: A | Discharge status: Home / Self Care | Payer: BC Managed Care – PPO | Attending: Orthopaedic Surgery | Admitting: Orthopaedic Surgery

## 2019-10-20 ENCOUNTER — Other Ambulatory Visit: Payer: Self-pay | Admitting: Orthopaedic Surgery

## 2019-10-20 DIAGNOSIS — M71161 Other infective bursitis, right knee: Secondary | ICD-10-CM | POA: Diagnosis present

## 2019-10-20 DIAGNOSIS — L03115 Cellulitis of right lower limb: Secondary | ICD-10-CM | POA: Diagnosis present

## 2019-10-20 DIAGNOSIS — Z20822 Contact with and (suspected) exposure to covid-19: Secondary | ICD-10-CM | POA: Diagnosis present

## 2019-10-20 DIAGNOSIS — F17210 Nicotine dependence, cigarettes, uncomplicated: Secondary | ICD-10-CM | POA: Diagnosis present

## 2019-10-20 DIAGNOSIS — Z79899 Other long term (current) drug therapy: Secondary | ICD-10-CM | POA: Diagnosis not present

## 2019-10-20 DIAGNOSIS — M7041 Prepatellar bursitis, right knee: Secondary | ICD-10-CM | POA: Diagnosis not present

## 2019-10-20 DIAGNOSIS — M009 Pyogenic arthritis, unspecified: Secondary | ICD-10-CM | POA: Diagnosis present

## 2019-10-20 LAB — RESPIRATORY PANEL BY RT PCR (FLU A&B, COVID)
Influenza A by PCR: NEGATIVE
Influenza B by PCR: NEGATIVE
SARS Coronavirus 2 by RT PCR: NEGATIVE

## 2019-10-20 LAB — CREATININE, SERUM
Creatinine, Ser: 0.98 mg/dL (ref 0.61–1.24)
GFR calc non Af Amer: >60 mL/min (ref >60)

## 2019-10-20 LAB — MRSA PCR SCREENING: MRSA by PCR: NEGATIVE

## 2019-10-20 MED ORDER — VANCOMYCIN HCL 1250 MG/250ML IV SOLN
1250.0000 mg | Freq: Three times a day (TID) | INTRAVENOUS | Status: DC
  Filled 2019-10-20 (×2): qty 250

## 2019-10-20 MED ORDER — ONDANSETRON HCL 4 MG/2ML IJ SOLN: 4.0000 mg | Freq: Four times a day (QID) | INTRAMUSCULAR | Status: DC | PRN

## 2019-10-20 MED ORDER — CHLORHEXIDINE GLUCONATE 4 % EX LIQD
60.0000 mL | Freq: Once | CUTANEOUS | Status: AC
  Administered 2019-10-21: 4 via TOPICAL
  Filled 2019-10-20: qty 60

## 2019-10-20 MED ORDER — ONDANSETRON HCL 4 MG PO TABS: 4.0000 mg | ORAL_TABLET | Freq: Four times a day (QID) | ORAL | Status: DC | PRN

## 2019-10-20 MED ORDER — METHOCARBAMOL 1000 MG/10ML IJ SOLN
500.0000 mg | Freq: Four times a day (QID) | INTRAVENOUS | Status: DC | PRN
  Filled 2019-10-20: qty 5

## 2019-10-20 MED ORDER — POVIDONE-IODINE 10 % EX SWAB
2.0000 "application " | Freq: Once | CUTANEOUS | Status: AC
  Administered 2019-10-21: 2 via TOPICAL

## 2019-10-20 MED ORDER — CELECOXIB 200 MG PO CAPS
200.0000 mg | ORAL_CAPSULE | Freq: Two times a day (BID) | ORAL | Status: DC
  Administered 2019-10-20 – 2019-10-21 (×2): 200 mg via ORAL
  Filled 2019-10-20 (×4): qty 1

## 2019-10-20 MED ORDER — POLYETHYLENE GLYCOL 3350 17 G PO PACK: 17.0000 g | PACK | Freq: Every day | ORAL | Status: DC | PRN

## 2019-10-20 MED ORDER — VANCOMYCIN HCL 10 G IV SOLR
2500.0000 mg | Freq: Once | INTRAVENOUS | Status: AC
  Administered 2019-10-20: 2500 mg via INTRAVENOUS
  Filled 2019-10-20: qty 2500

## 2019-10-20 MED ORDER — METHOCARBAMOL 500 MG PO TABS
500.0000 mg | ORAL_TABLET | Freq: Four times a day (QID) | ORAL | Status: DC | PRN
  Administered 2019-10-21: 500 mg via ORAL

## 2019-10-20 MED ORDER — CEFAZOLIN SODIUM-DEXTROSE 2-4 GM/100ML-% IV SOLN
2.0000 g | INTRAVENOUS | Status: AC
  Administered 2019-10-21: 2 g via INTRAVENOUS
  Filled 2019-10-20: qty 100

## 2019-10-20 MED ORDER — DOCUSATE SODIUM 100 MG PO CAPS
100.0000 mg | ORAL_CAPSULE | Freq: Two times a day (BID) | ORAL | Status: DC
  Administered 2019-10-20 – 2019-10-21 (×2): 100 mg via ORAL
  Filled 2019-10-20 (×2): qty 1

## 2019-10-20 MED ORDER — ACETAMINOPHEN 500 MG PO TABS
1000.0000 mg | ORAL_TABLET | Freq: Four times a day (QID) | ORAL | Status: AC
  Administered 2019-10-20 – 2019-10-21 (×4): 1000 mg via ORAL
  Filled 2019-10-20 (×4): qty 2

## 2019-10-20 MED ORDER — PIPERACILLIN-TAZOBACTAM 3.375 G IVPB
3.3750 g | Freq: Three times a day (TID) | INTRAVENOUS | Status: DC
  Administered 2019-10-20 – 2019-10-22 (×6): 3.375 g via INTRAVENOUS
  Filled 2019-10-20 (×6): qty 50

## 2019-10-20 MED ORDER — DIPHENHYDRAMINE HCL 12.5 MG/5ML PO ELIX
12.5000 mg | ORAL_SOLUTION | ORAL | Status: DC | PRN
  Administered 2019-10-20: 25 mg via ORAL
  Filled 2019-10-20: qty 10

## 2019-10-20 MED ORDER — POTASSIUM CHLORIDE IN NACL 20-0.9 MEQ/L-% IV SOLN
INTRAVENOUS | Status: DC
  Filled 2019-10-20 (×2): qty 1000

## 2019-10-20 NOTE — Progress Notes (Signed)
Patient complained of sudden itching starting from his head to his back. Assessed patients skin and noticed patients back was reddened mostly upper back down to shoulders. Patient was scratching moderately and noticed some risen bumps to upper arm. Think patient maybe having reaction to antibiotics moreso the vancomycin. Vancomycin stopped at 2140 and benadryl po prn given. Orthopedic MD on call paged. Waiting for return callback. Will continue to monitor.

## 2019-10-20 NOTE — Progress Notes (Signed)
MD Rayfield Citizen returned call for patient. Agreed to stop Vancomycin for tonight give benadryl and continue Zosyn antibiotic. MD will order antibiotic for pre-op in am. Will continue to monitor.

## 2019-10-20 NOTE — Progress Notes (Addendum)
Pharmacy Antibiotic Note  LIEF PALMATIER is a 29 y.o. male admitted on 10/20/2019 with R septic knee. He has failed multiple rounds of antibiotics and symptoms are worsening; possible I&D of R knee with prepatellar bursectomy planned this admission. Pharmacy has been consulted for vancomycin and Zosyn dosing.  Plan: Zosyn 3.375 gm IV Q 8 hrs (extended infusion) Vancomycin 2500 mg IV X 1, followed by vancomycin 1250 mg IV Q 8 hrs, per [facility] vancomycin protocol (goal vancomycin trough: 15-20 mg/L) Monitor WBC, temp, clinical improvement, cultures, renal function, vancomycin levels as indicated  Estimated Creatinine Clearance: 156.8 mL/min (by C-G formula based on SCr of 0.98 mg/dL).    No Known Allergies  Antimicrobials this admission: 10/5 Zosyn >> 10/5 vancomycin >>  Microbiology results: 10/5 COVID, flu A, flu B: all negative  Thank you for allowing pharmacy to be a part of this patient's care.  Vicki Mallet, PharmD, BCPS, [facility] Clinical Pharmacist 10/20/2019 5:39 PM

## 2019-10-20 NOTE — H&P (Signed)
PREOPERATIVE H&P  Chief Complaint: RIGHT SEPTIC KNEE  HPI: Tyler King is a 29 y.o. male who is scheduled for IRRIGATION AND DEBRIDEMENT WOUND AND PREPATELLAR BURSECTOMY.   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 He has been treated with Augmentin Prednisone, Doxycycline and Keflex, IM Rocephin, and Moxifloxacin. He has had no improvements of his symptoms. His knee is very painful. History of previous abscesses including right thigh and right hand.   Symptoms are rated as moderate to severe, and have been worsening.  This is significantly impairing activities of daily living.    Please see clinic note for further details on this patient's care.    He has elected for surgical management.   No past medical history on file. Past Surgical History:  Procedure Laterality Date  . INCISION AND DRAINAGE ABSCESS Left 07/22/2012   Procedure: INCISION AND DRAINAGE ABSCESS  Foreign body removal ;  Surgeon: Tami Ribas, MD;  Location: [facility];  Service: Orthopedics;  Laterality: Left;   Social History   Socioeconomic History  . Marital status: Single    Spouse name: Not on file  . Number of children: Not on file  . Years of education: Not on file  . Highest education level: Not on file  Occupational History  . Not on file  Tobacco Use  . Smoking status: Current Every Day Smoker    Packs/day: 1.00    Years: 2.00    Pack years: 2.00    Types: Cigarettes  Substance and Sexual Activity  . Alcohol use: No  . Drug use: No  . Sexual activity: Not on file  Other Topics Concern  . Not on file  Social History Narrative  . Not on file   Social Determinants of Health   Financial Resource Strain:   . Difficulty of Paying Living Expenses: Not on file  Food Insecurity:   . Worried About Programme researcher, broadcasting/film/video in the Last Year: Not on file  . Ran Out of Food in the Last Year: Not on file  Transportation  Needs:   . Lack of Transportation (Medical): Not on file  . Lack of Transportation (Non-Medical): Not on file  Physical Activity:   . Days of Exercise per Week: Not on file  . Minutes of Exercise per Session: Not on file  Stress:   . Feeling of Stress : Not on file  Social Connections:   . Frequency of Communication with Friends and Family: Not on file  . Frequency of Social Gatherings with Friends and Family: Not on file  . Attends Religious Services: Not on file  . Active Member of Clubs or Organizations: Not on file  . Attends Banker Meetings: Not on file  . Marital Status: Not on file   No family history on file. No Known Allergies Prior to Admission medications   Medication Sig Start Date End Date Taking? Authorizing Provider  Buprenorphine HCl-Naloxone HCl 8-2 MG FILM Place 1.5 Film under the tongue daily. 10/07/19  Yes [provider]  moxifloxacin (AVELOX) 400 MG tablet Take 400 mg by mouth daily. 10/15/19  Yes [provider]    ROS: All other systems have been reviewed and were otherwise negative with the exception of those mentioned in the HPI and as above.  Physical Exam: General: Alert, no acute distress Cardiovascular: No pedal edema Respiratory: No cyanosis, no use of accessory musculature GI: No organomegaly, abdomen is  soft and non-tender Skin: No lesions in the area of chief complaint Neurologic: Sensation intact distally Psychiatric: Patient is competent for consent with normal mood and affect Lymphatic: No axillary or cervical lymphadenopathy  MUSCULOSKELETAL:  Right knee: significant prepatellar bursitis with surrounding cellulitis that extends down lower leg. Lower leg swelling. Tender to palpation right knee. Limited ROM due to pain.   Imaging: X-rays showing no acute bony abnormalities.   Assessment: RIGHT SEPTIC KNEE  Plan: Plan for Procedure(s): IRRIGATION AND DEBRIDEMENT WOUND AND PREPATELLAR BURSECTOMY  Patient  has failed treatment with multiple rounds of antibiotics. Symptoms are worsening. We will admit the patient for IV antibiotics. We will consult pharmacy for IV vancomycin and zosyn dosing. We will check how he is doing in the morning. If he has improvements in his symptoms, may continue treatment with IV antbiotics and then discharge on oral. If no improvements, then plan for irrigation and debridement of right knee with prepatellar bursectomy.   The risks benefits and alternatives were discussed with the patient including but not limited to the risks of nonoperative treatment, versus surgical intervention including infection, bleeding, nerve injury,  blood clots, cardiopulmonary complications, morbidity, mortality, among others, and they were willing to proceed. .   The patient acknowledged the explanation, agreed to proceed with the plan and consent was signed.   Operative Plan: Right knee irrigation and debridement with bursectomy  Discharge Medications: Standard - avoid opioids on suboxone DVT Prophylaxis: Aspirin    Vernetta Honey, PA-C  10/20/2019 4:34 PM

## 2019-10-20 NOTE — H&P (View-Only) (Signed)
PREOPERATIVE H&P  Chief Complaint: RIGHT SEPTIC KNEE  HPI: Tyler King is a 29 y.o. male who is scheduled for IRRIGATION AND DEBRIDEMENT WOUND AND PREPATELLAR BURSECTOMY.   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 He has been treated with Augmentin Prednisone, Doxycycline and Keflex, IM Rocephin, and Moxifloxacin. He has had no improvements of his symptoms. His knee is very painful. History of previous abscesses including right thigh and right hand.   Symptoms are rated as moderate to severe, and have been worsening.  This is significantly impairing activities of daily living.    Please see clinic note for further details on this patient's care.    He has elected for surgical management.   No past medical history on file. Past Surgical History:  Procedure Laterality Date  . INCISION AND DRAINAGE ABSCESS Left 07/22/2012   Procedure: INCISION AND DRAINAGE ABSCESS  Foreign body removal ;  Surgeon: Tami Ribas, MD;  Location: [facility];  Service: Orthopedics;  Laterality: Left;   Social History   Socioeconomic History  . Marital status: Single    Spouse name: Not on file  . Number of children: Not on file  . Years of education: Not on file  . Highest education level: Not on file  Occupational History  . Not on file  Tobacco Use  . Smoking status: Current Every Day Smoker    Packs/day: 1.00    Years: 2.00    Pack years: 2.00    Types: Cigarettes  Substance and Sexual Activity  . Alcohol use: No  . Drug use: No  . Sexual activity: Not on file  Other Topics Concern  . Not on file  Social History Narrative  . Not on file   Social Determinants of Health   Financial Resource Strain:   . Difficulty of Paying Living Expenses: Not on file  Food Insecurity:   . Worried About Programme researcher, broadcasting/film/video in the Last Year: Not on file  . Ran Out of Food in the Last Year: Not on file  Transportation  Needs:   . Lack of Transportation (Medical): Not on file  . Lack of Transportation (Non-Medical): Not on file  Physical Activity:   . Days of Exercise per Week: Not on file  . Minutes of Exercise per Session: Not on file  Stress:   . Feeling of Stress : Not on file  Social Connections:   . Frequency of Communication with Friends and Family: Not on file  . Frequency of Social Gatherings with Friends and Family: Not on file  . Attends Religious Services: Not on file  . Active Member of Clubs or Organizations: Not on file  . Attends Banker Meetings: Not on file  . Marital Status: Not on file   No family history on file. No Known Allergies Prior to Admission medications   Medication Sig Start Date End Date Taking? Authorizing Provider  Buprenorphine HCl-Naloxone HCl 8-2 MG FILM Place 1.5 Film under the tongue daily. 10/07/19  Yes [provider]  moxifloxacin (AVELOX) 400 MG tablet Take 400 mg by mouth daily. 10/15/19  Yes [provider]    ROS: All other systems have been reviewed and were otherwise negative with the exception of those mentioned in the HPI and as above.  Physical Exam: General: Alert, no acute distress Cardiovascular: No pedal edema Respiratory: No cyanosis, no use of accessory musculature GI: No organomegaly, abdomen is  soft and non-tender Skin: No lesions in the area of chief complaint Neurologic: Sensation intact distally Psychiatric: Patient is competent for consent with normal mood and affect Lymphatic: No axillary or cervical lymphadenopathy  MUSCULOSKELETAL:  Right knee: significant prepatellar bursitis with surrounding cellulitis that extends down lower leg. Lower leg swelling. Tender to palpation right knee. Limited ROM due to pain.   Imaging: X-rays showing no acute bony abnormalities.   Assessment: RIGHT SEPTIC KNEE  Plan: Plan for Procedure(s): IRRIGATION AND DEBRIDEMENT WOUND AND PREPATELLAR BURSECTOMY  Patient  has failed treatment with multiple rounds of antibiotics. Symptoms are worsening. We will admit the patient for IV antibiotics. We will consult pharmacy for IV vancomycin and zosyn dosing. We will check how he is doing in the morning. If he has improvements in his symptoms, may continue treatment with IV antbiotics and then discharge on oral. If no improvements, then plan for irrigation and debridement of right knee with prepatellar bursectomy.   The risks benefits and alternatives were discussed with the patient including but not limited to the risks of nonoperative treatment, versus surgical intervention including infection, bleeding, nerve injury,  blood clots, cardiopulmonary complications, morbidity, mortality, among others, and they were willing to proceed. .   The patient acknowledged the explanation, agreed to proceed with the plan and consent was signed.   Operative Plan: Right knee irrigation and debridement with bursectomy  Discharge Medications: Standard - avoid opioids on suboxone DVT Prophylaxis: Aspirin    Vernetta Honey, PA-C  10/20/2019 4:34 PM

## 2019-10-20 NOTE — Progress Notes (Signed)
I spoke to Tyler King who reported that he is going to be admitted today to start on antibiotics. I called bed placement to verify and it was confirmed.

## 2019-10-21 ENCOUNTER — Inpatient Hospital Stay (HOSPITAL_COMMUNITY): Payer: BC Managed Care – PPO | Admitting: Anesthesiology

## 2019-10-21 ENCOUNTER — Encounter (HOSPITAL_COMMUNITY)
Admission: AD | Disposition: A | Discharge status: Home / Self Care | Payer: Self-pay | Source: Ambulatory Visit | Attending: Orthopaedic Surgery

## 2019-10-21 ENCOUNTER — Encounter (HOSPITAL_COMMUNITY): Payer: Self-pay | Admitting: Orthopaedic Surgery

## 2019-10-21 ENCOUNTER — Other Ambulatory Visit: Payer: Self-pay

## 2019-10-21 ENCOUNTER — Inpatient Hospital Stay (HOSPITAL_COMMUNITY)
Admission: RE | Admit: 2019-10-21 | Payer: BC Managed Care – PPO | Source: Home / Self Care | Admitting: Orthopaedic Surgery

## 2019-10-21 HISTORY — PX: INCISION AND DRAINAGE OF WOUND: SHX1803

## 2019-10-21 LAB — CBC
HCT: 38.1 % — ABNORMAL LOW (ref 39.0–52.0)
Hemoglobin: 12.3 g/dL — ABNORMAL LOW (ref 13.0–17.0)
MCH: 27.4 pg (ref 26.0–34.0)
MCHC: 32.3 g/dL (ref 30.0–36.0)
MCV: 84.9 fL (ref 80.0–100.0)
Platelets: 218 10*3/uL (ref 150–400)
RBC: 4.49 MIL/uL (ref 4.22–5.81)
RDW: 11.3 % — ABNORMAL LOW (ref 11.5–15.5)
WBC: 12.5 10*3/uL — ABNORMAL HIGH (ref 4.0–10.5)
nRBC: 0 % (ref 0.0–0.2)

## 2019-10-21 SURGERY — IRRIGATION AND DEBRIDEMENT WOUND
Anesthesia: General | Site: Knee | Laterality: Right

## 2019-10-21 MED ORDER — FENTANYL CITRATE (PF) 100 MCG/2ML IJ SOLN
25.0000 ug | INTRAMUSCULAR | Status: DC | PRN
  Administered 2019-10-21 (×2): 50 ug via INTRAVENOUS

## 2019-10-21 MED ORDER — LIDOCAINE HCL (CARDIAC) PF 100 MG/5ML IV SOSY
PREFILLED_SYRINGE | INTRAVENOUS | Status: DC | PRN
  Administered 2019-10-21: 100 mg via INTRAVENOUS

## 2019-10-21 MED ORDER — BUPRENORPHINE HCL 8 MG SL SUBL
4.0000 mg | SUBLINGUAL_TABLET | Freq: Once | SUBLINGUAL | Status: DC
  Filled 2019-10-21: qty 1

## 2019-10-21 MED ORDER — VANCOMYCIN HCL 1000 MG IV SOLR
INTRAVENOUS | Status: DC | PRN
  Administered 2019-10-21: 1000 mg

## 2019-10-21 MED ORDER — DEXMEDETOMIDINE (PRECEDEX) IN NS 20 MCG/5ML (4 MCG/ML) IV SYRINGE
PREFILLED_SYRINGE | INTRAVENOUS | Status: DC | PRN
  Administered 2019-10-21: 20 ug via INTRAVENOUS

## 2019-10-21 MED ORDER — BUPIVACAINE HCL (PF) 0.25 % IJ SOLN
INTRAMUSCULAR | Status: AC
  Filled 2019-10-21: qty 30

## 2019-10-21 MED ORDER — DIPHENHYDRAMINE HCL 12.5 MG/5ML PO ELIX: 12.5000 mg | ORAL_SOLUTION | ORAL | Status: DC | PRN

## 2019-10-21 MED ORDER — ONDANSETRON HCL 4 MG/2ML IJ SOLN: 4.0000 mg | Freq: Once | INTRAMUSCULAR | Status: DC | PRN

## 2019-10-21 MED ORDER — TOBRAMYCIN SULFATE 80 MG/2ML IJ SOLN
INTRAMUSCULAR | Status: AC
  Filled 2019-10-21: qty 2

## 2019-10-21 MED ORDER — POTASSIUM CHLORIDE IN NACL 20-0.9 MEQ/L-% IV SOLN
INTRAVENOUS | Status: DC
  Filled 2019-10-21: qty 1000

## 2019-10-21 MED ORDER — BUPRENORPHINE HCL 8 MG SL SUBL
4.0000 mg | SUBLINGUAL_TABLET | Freq: Every day | SUBLINGUAL | Status: DC
Start: 2019-10-21 — End: 2019-10-21

## 2019-10-21 MED ORDER — METHOCARBAMOL 1000 MG/10ML IJ SOLN
1000.0000 mg | Freq: Four times a day (QID) | INTRAVENOUS | Status: DC | PRN
  Filled 2019-10-21: qty 10

## 2019-10-21 MED ORDER — KETOROLAC TROMETHAMINE 15 MG/ML IJ SOLN
15.0000 mg | Freq: Four times a day (QID) | INTRAMUSCULAR | Status: AC
  Administered 2019-10-21 – 2019-10-22 (×3): 15 mg via INTRAVENOUS
  Filled 2019-10-21 (×3): qty 1

## 2019-10-21 MED ORDER — METHOCARBAMOL 500 MG PO TABS
ORAL_TABLET | ORAL | Status: AC
  Filled 2019-10-21: qty 1

## 2019-10-21 MED ORDER — METHOCARBAMOL 1000 MG/10ML IJ SOLN
500.0000 mg | Freq: Four times a day (QID) | INTRAVENOUS | Status: DC | PRN
  Filled 2019-10-21: qty 5

## 2019-10-21 MED ORDER — ONDANSETRON HCL 4 MG/2ML IJ SOLN
INTRAMUSCULAR | Status: AC
  Filled 2019-10-21: qty 2

## 2019-10-21 MED ORDER — KETOROLAC TROMETHAMINE 15 MG/ML IJ SOLN
7.5000 mg | Freq: Four times a day (QID) | INTRAMUSCULAR | Status: DC
  Administered 2019-10-21: 7.5 mg via INTRAVENOUS
  Filled 2019-10-21: qty 1

## 2019-10-21 MED ORDER — LACTATED RINGERS IV SOLN: INTRAVENOUS | Status: DC

## 2019-10-21 MED ORDER — ACETAMINOPHEN 160 MG/5ML PO SOLN: 325.0000 mg | ORAL | Status: DC | PRN

## 2019-10-21 MED ORDER — FENTANYL CITRATE (PF) 250 MCG/5ML IJ SOLN
INTRAMUSCULAR | Status: AC
  Filled 2019-10-21: qty 5

## 2019-10-21 MED ORDER — BUPIVACAINE HCL 0.25 % IJ SOLN
INTRAMUSCULAR | Status: DC | PRN
  Administered 2019-10-21: 10 mL

## 2019-10-21 MED ORDER — MIDAZOLAM HCL 5 MG/5ML IJ SOLN
INTRAMUSCULAR | Status: DC | PRN
  Administered 2019-10-21: 2 mg via INTRAVENOUS

## 2019-10-21 MED ORDER — BUPRENORPHINE HCL 8 MG SL SUBL
12.0000 mg | SUBLINGUAL_TABLET | Freq: Every day | SUBLINGUAL | Status: DC
  Administered 2019-10-22: 12 mg via SUBLINGUAL
  Filled 2019-10-21 (×2): qty 2

## 2019-10-21 MED ORDER — METOCLOPRAMIDE HCL 5 MG PO TABS: 5.0000 mg | ORAL_TABLET | Freq: Three times a day (TID) | ORAL | Status: DC | PRN

## 2019-10-21 MED ORDER — TOBRAMYCIN SULFATE 1.2 G IJ SOLR
INTRAMUSCULAR | Status: DC | PRN
  Administered 2019-10-21: 1.2 g

## 2019-10-21 MED ORDER — CHLORHEXIDINE GLUCONATE 0.12 % MT SOLN: 15.0000 mL | Freq: Once | OROMUCOSAL | Status: AC

## 2019-10-21 MED ORDER — CLINDAMYCIN PHOSPHATE 600 MG/50ML IV SOLN
600.0000 mg | Freq: Four times a day (QID) | INTRAVENOUS | Status: AC
  Administered 2019-10-21 – 2019-10-22 (×3): 600 mg via INTRAVENOUS
  Filled 2019-10-21 (×3): qty 50

## 2019-10-21 MED ORDER — ACETAMINOPHEN 500 MG PO TABS
1000.0000 mg | ORAL_TABLET | Freq: Four times a day (QID) | ORAL | Status: AC
  Administered 2019-10-21 – 2019-10-22 (×4): 1000 mg via ORAL
  Filled 2019-10-21 (×4): qty 2

## 2019-10-21 MED ORDER — FENTANYL CITRATE (PF) 100 MCG/2ML IJ SOLN
INTRAMUSCULAR | Status: AC
  Filled 2019-10-21: qty 2

## 2019-10-21 MED ORDER — BUPRENORPHINE HCL 8 MG SL SUBL: 4.0000 mg | SUBLINGUAL_TABLET | Freq: Every day | SUBLINGUAL | Status: DC

## 2019-10-21 MED ORDER — 0.9 % SODIUM CHLORIDE (POUR BTL) OPTIME
TOPICAL | Status: DC | PRN
  Administered 2019-10-21: 1000 mL

## 2019-10-21 MED ORDER — OXYCODONE HCL 5 MG/5ML PO SOLN: 5.0000 mg | Freq: Once | ORAL | Status: DC | PRN

## 2019-10-21 MED ORDER — CHLORHEXIDINE GLUCONATE 0.12 % MT SOLN
OROMUCOSAL | Status: AC
  Administered 2019-10-21: 15 mL via OROMUCOSAL
  Filled 2019-10-21: qty 15

## 2019-10-21 MED ORDER — ACETAMINOPHEN 325 MG PO TABS: 325.0000 mg | ORAL_TABLET | ORAL | Status: DC | PRN

## 2019-10-21 MED ORDER — MEPERIDINE HCL 25 MG/ML IJ SOLN: 6.2500 mg | INTRAMUSCULAR | Status: DC | PRN

## 2019-10-21 MED ORDER — ONDANSETRON HCL 4 MG PO TABS: 4.0000 mg | ORAL_TABLET | Freq: Four times a day (QID) | ORAL | Status: DC | PRN

## 2019-10-21 MED ORDER — PROPOFOL 10 MG/ML IV BOLUS
INTRAVENOUS | Status: DC | PRN
  Administered 2019-10-21: 200 mg via INTRAVENOUS

## 2019-10-21 MED ORDER — ONDANSETRON HCL 4 MG/2ML IJ SOLN: 4.0000 mg | Freq: Four times a day (QID) | INTRAMUSCULAR | Status: DC | PRN

## 2019-10-21 MED ORDER — TOBRAMYCIN SULFATE 1.2 G IJ SOLR
INTRAMUSCULAR | Status: AC
  Filled 2019-10-21: qty 1.2

## 2019-10-21 MED ORDER — METOCLOPRAMIDE HCL 5 MG/ML IJ SOLN: 5.0000 mg | Freq: Three times a day (TID) | INTRAMUSCULAR | Status: DC | PRN

## 2019-10-21 MED ORDER — SODIUM CHLORIDE 0.9 % IR SOLN
Status: DC | PRN
  Administered 2019-10-21: 3000 mL

## 2019-10-21 MED ORDER — DEXAMETHASONE SODIUM PHOSPHATE 10 MG/ML IJ SOLN
INTRAMUSCULAR | Status: AC
  Filled 2019-10-21: qty 1

## 2019-10-21 MED ORDER — ONDANSETRON HCL 4 MG/2ML IJ SOLN
INTRAMUSCULAR | Status: DC | PRN
  Administered 2019-10-21: 4 mg via INTRAVENOUS

## 2019-10-21 MED ORDER — VANCOMYCIN HCL 1000 MG IV SOLR
INTRAVENOUS | Status: AC
  Filled 2019-10-21: qty 1000

## 2019-10-21 MED ORDER — METHOCARBAMOL 500 MG PO TABS
1000.0000 mg | ORAL_TABLET | Freq: Four times a day (QID) | ORAL | Status: DC | PRN
  Administered 2019-10-22 (×2): 1000 mg via ORAL
  Filled 2019-10-21 (×2): qty 2

## 2019-10-21 MED ORDER — MAGNESIUM CITRATE PO SOLN: 1.0000 | Freq: Once | ORAL | Status: DC | PRN

## 2019-10-21 MED ORDER — LIDOCAINE 2% (20 MG/ML) 5 ML SYRINGE
INTRAMUSCULAR | Status: AC
  Filled 2019-10-21: qty 5

## 2019-10-21 MED ORDER — BISACODYL 5 MG PO TBEC: 5.0000 mg | DELAYED_RELEASE_TABLET | Freq: Every day | ORAL | Status: DC | PRN

## 2019-10-21 MED ORDER — DOCUSATE SODIUM 100 MG PO CAPS
100.0000 mg | ORAL_CAPSULE | Freq: Two times a day (BID) | ORAL | Status: DC
  Administered 2019-10-21 – 2019-10-22 (×2): 100 mg via ORAL
  Filled 2019-10-21 (×2): qty 1

## 2019-10-21 MED ORDER — GABAPENTIN 300 MG PO CAPS
300.0000 mg | ORAL_CAPSULE | Freq: Three times a day (TID) | ORAL | Status: DC
  Administered 2019-10-21 – 2019-10-22 (×3): 300 mg via ORAL
  Filled 2019-10-21 (×3): qty 1

## 2019-10-21 MED ORDER — METHOCARBAMOL 500 MG PO TABS
500.0000 mg | ORAL_TABLET | Freq: Four times a day (QID) | ORAL | Status: DC | PRN
  Administered 2019-10-21: 500 mg via ORAL
  Filled 2019-10-21: qty 1

## 2019-10-21 MED ORDER — POLYETHYLENE GLYCOL 3350 17 G PO PACK: 17.0000 g | PACK | Freq: Every day | ORAL | Status: DC | PRN

## 2019-10-21 MED ORDER — OXYCODONE HCL 5 MG PO TABS: 5.0000 mg | ORAL_TABLET | Freq: Once | ORAL | Status: DC | PRN

## 2019-10-21 MED ORDER — PROPOFOL 10 MG/ML IV BOLUS
INTRAVENOUS | Status: AC
  Filled 2019-10-21: qty 40

## 2019-10-21 MED ORDER — MIDAZOLAM HCL 2 MG/2ML IJ SOLN
INTRAMUSCULAR | Status: AC
  Filled 2019-10-21: qty 2

## 2019-10-21 MED ORDER — BUPRENORPHINE HCL 8 MG SL SUBL
8.0000 mg | SUBLINGUAL_TABLET | Freq: Every day | SUBLINGUAL | Status: DC
  Administered 2019-10-21: 8 mg via SUBLINGUAL

## 2019-10-21 MED ORDER — ENOXAPARIN SODIUM 40 MG/0.4ML ~~LOC~~ SOLN
40.0000 mg | SUBCUTANEOUS | Status: DC
  Administered 2019-10-22: 40 mg via SUBCUTANEOUS
  Filled 2019-10-21: qty 0.4

## 2019-10-21 SURGICAL SUPPLY — 51 items
BNDG ELASTIC 4X5.8 VLCR STR LF (GAUZE/BANDAGES/DRESSINGS) ×2 IMPLANT
BNDG ELASTIC 6X10 VLCR STRL LF (GAUZE/BANDAGES/DRESSINGS) ×2 IMPLANT
BNDG ELASTIC 6X5.8 VLCR STR LF (GAUZE/BANDAGES/DRESSINGS) ×2 IMPLANT
BNDG GAUZE ELAST 4 BULKY (GAUZE/BANDAGES/DRESSINGS) ×2 IMPLANT
CNTNR URN SCR LID CUP LEK RST (MISCELLANEOUS) IMPLANT
CONT SPEC 4OZ STRL OR WHT (MISCELLANEOUS)
COVER SURGICAL LIGHT HANDLE (MISCELLANEOUS) ×2 IMPLANT
COVER WAND RF STERILE (DRAPES) ×2 IMPLANT
CUFF TOURN SGL LL 12 NO SLV (MISCELLANEOUS) IMPLANT
CUFF TOURN SGL QUICK 34 (TOURNIQUET CUFF)
CUFF TRNQT CYL 34X4.125X (TOURNIQUET CUFF) IMPLANT
DRAPE U-SHAPE 47X51 STRL (DRAPES) IMPLANT
DRSG PAD ABDOMINAL 8X10 ST (GAUZE/BANDAGES/DRESSINGS) ×2 IMPLANT
DURAPREP 26ML APPLICATOR (WOUND CARE) ×2 IMPLANT
ELECT REM PT RETURN 9FT ADLT (ELECTROSURGICAL)
ELECTRODE REM PT RTRN 9FT ADLT (ELECTROSURGICAL) IMPLANT
GAUZE PACKING IODOFORM 1/2 (PACKING) ×2 IMPLANT
GAUZE SPONGE 4X4 12PLY STRL (GAUZE/BANDAGES/DRESSINGS) ×2 IMPLANT
GAUZE XEROFORM 1X8 LF (GAUZE/BANDAGES/DRESSINGS) ×2 IMPLANT
GLOVE BIO SURGEON STRL SZ 6.5 (GLOVE) ×2 IMPLANT
GLOVE BIOGEL PI IND STRL 8 (GLOVE) ×1 IMPLANT
GLOVE BIOGEL PI INDICATOR 8 (GLOVE) ×1
GLOVE ECLIPSE 8.0 STRL XLNG CF (GLOVE) ×4 IMPLANT
GLOVE INDICATOR 6.5 STRL GRN (GLOVE) ×2 IMPLANT
GOWN STRL REUS W/ TWL LRG LVL3 (GOWN DISPOSABLE) ×2 IMPLANT
GOWN STRL REUS W/ TWL XL LVL3 (GOWN DISPOSABLE) ×1 IMPLANT
GOWN STRL REUS W/TWL LRG LVL3 (GOWN DISPOSABLE) ×2
GOWN STRL REUS W/TWL XL LVL3 (GOWN DISPOSABLE) ×1
HANDPIECE INTERPULSE COAX TIP (DISPOSABLE)
KIT BASIN OR (CUSTOM PROCEDURE TRAY) ×2 IMPLANT
KIT TURNOVER KIT B (KITS) ×2 IMPLANT
MANIFOLD NEPTUNE II (INSTRUMENTS) ×2 IMPLANT
NS IRRIG 1000ML POUR BTL (IV SOLUTION) ×2 IMPLANT
PACK ORTHO EXTREMITY (CUSTOM PROCEDURE TRAY) ×2 IMPLANT
PAD ARMBOARD 7.5X6 YLW CONV (MISCELLANEOUS) ×4 IMPLANT
PAD CAST 4YDX4 CTTN HI CHSV (CAST SUPPLIES) ×1 IMPLANT
PADDING CAST ABS 4INX4YD NS (CAST SUPPLIES) ×1
PADDING CAST ABS 6INX4YD NS (CAST SUPPLIES) ×1
PADDING CAST ABS COTTON 4X4 ST (CAST SUPPLIES) ×1 IMPLANT
PADDING CAST ABS COTTON 6X4 NS (CAST SUPPLIES) ×1 IMPLANT
PADDING CAST COTTON 4X4 STRL (CAST SUPPLIES) ×1
PADDING CAST COTTON 6X4 STRL (CAST SUPPLIES) ×2 IMPLANT
SET HNDPC FAN SPRY TIP SCT (DISPOSABLE) IMPLANT
SPONGE LAP 18X18 RF (DISPOSABLE) IMPLANT
SUT ETHILON 2 0 FS 18 (SUTURE) ×4 IMPLANT
SWAB CULTURE ESWAB REG 1ML (MISCELLANEOUS) IMPLANT
TOWEL GREEN STERILE (TOWEL DISPOSABLE) ×2 IMPLANT
TOWEL GREEN STERILE FF (TOWEL DISPOSABLE) ×2 IMPLANT
TUBE CONNECTING 12X1/4 (SUCTIONS) ×2 IMPLANT
UNDERPAD 30X36 HEAVY ABSORB (UNDERPADS AND DIAPERS) ×2 IMPLANT
YANKAUER SUCT BULB TIP NO VENT (SUCTIONS) ×2 IMPLANT

## 2019-10-21 NOTE — Anesthesia Preprocedure Evaluation (Addendum)
Anesthesia Evaluation  Patient identified by MRN, date of birth, ID band Patient awake    Reviewed: Allergy & Precautions, H&P , NPO status , Patient's Chart, lab work & pertinent test results  Airway Mallampati: II  TM Distance: >3 FB Neck ROM: Full    Dental  (+) Teeth Intact, Dental Advisory Given   Pulmonary Current Smoker,    breath sounds clear to auscultation       Cardiovascular  Rhythm:Regular Rate:Normal     Neuro/Psych    GI/Hepatic (+)     substance abuse  ,   Endo/Other    Renal/GU      Musculoskeletal  (+) narcotic dependent  Abdominal   Peds  Hematology   Anesthesia Other Findings   Reproductive/Obstetrics                            Anesthesia Physical  Anesthesia Plan  ASA: II  Anesthesia Plan: General   Post-op Pain Management:    Induction: Intravenous  PONV Risk Score and Plan: 1 and Ondansetron  Airway Management Planned: LMA  Additional Equipment:   Intra-op Plan:   Post-operative Plan: Extubation in OR  Informed Consent: I have reviewed the patients History and Physical, chart, labs and discussed the procedure including the risks, benefits and alternatives for the proposed anesthesia with the patient or authorized representative who has indicated his/her understanding and acceptance.       Plan Discussed with: CRNA, Surgeon and Anesthesiologist  Anesthesia Plan Comments:        Anesthesia Quick Evaluation

## 2019-10-21 NOTE — Op Note (Signed)
Orthopaedic Surgery Operative Note (CSN: [id number])  Tyler King  28-Dec-1990 Date of Surgery: 10/21/2019   Diagnoses:  Right septic prepatellar bursitis  Procedure: I&D of deep abscess Prepatellar bursectomy   Operative Finding Successful completion of the planned procedure.  Patient's knee motion was intact and there is no sign of a septic joint.  He had significant purulent material consistent with an abscess in the anterior portion of the prepatellar bursa and were able to do a complete resection.  We left the wound open and packed about a foot of half inch iodoform into the wound.  We did place simple stitches above and below the central open portion.  Post-operative plan: The patient will be weightbearing to tolerance in a knee immobilizer to protect the incision.  The patient will be admitted overnight for IV antibiotics and discharge home tomorrow.  DVT prophylaxis Aspirin 81 mg twice daily for 6 weeks.   Pain control with PRN pain medication preferring oral medicines.  Follow up plan will be scheduled in approximately 7 days for incision check and XR.  Post-Op Diagnosis: Same Surgeons:Primary: Bjorn Pippin, MD Assistants:Caroline McBane PA-C Location: [facility] OR ROOM 04 Anesthesia: General with local anesthesia Antibiotics: Ancef 2 g and Zosyn on the floor with 1 g of vancomycin and 1.2 g of tobramycin locally Tourniquet time: None Estimated Blood Loss: 25 Complications: None Specimens: Right knee prepatellar bursal tissue Implants: * No implants in log *  Indications for Surgery:   Tyler King is a 29 y.o. male with prepatellar septic bursitis managed by my partner nonoperatively for quite some time with no improvement.  I was asked to step in due to OR availability.  We talked to the patient after keeping on IV antibiotics overnight and noted that there was no significant improvement in his overall appearance and that we felt that an incision debridement as well as a  bursectomy would be appropriate.  He elected to proceed.  Benefits and risks of operative and nonoperative management were discussed prior to surgery with patient/guardian(s) and informed consent form was completed.  Specific risks including infection, need for additional surgery, continued infection, need for antibiotics, need for revision surgery amongst others   Procedure:   The patient was identified properly. Informed consent was obtained and the surgical site was marked. The patient was taken up to suite where general anesthesia was induced.  The patient was positioned supine on a regular bed.  The right knee was prepped and draped in the usual sterile fashion.  Timeout was performed before the beginning of the case.  We began with a anterior approach to the knee going through skin sharply achieving hemostasis we progressed.  We identified the prepatellar bursal sac which was clearly inflamed and infected.  We were able to do a complete bursectomy taking out the bursa and pieces.  We preserve full-thickness skin flaps to avoid skin damage.  That point we used a rondure and a curette to excisionally debride bursa, skin, subcutaneous tissue and fascial tissue.  This was a subcutaneous abscess in the prepatellar bursa.  At this point we irrigated with 3 L normal saline and debrided again before irrigating one more time.  We noted that the incision was free of unhealthy appearing tissue.  We then placed multiple simple sutures at the proximal distal aspects of our 5 cm incision leaving the central portion open.  We then able to place local vancomycin and tobramycin powder before packing with iodoform gauze.  Local anesthetic  was filtrated.  Placed a bulky dressing and a knee immobilizer.  Patient was awoken taken to PACU in stable condition.  Alfonse Alpers, PA-C, present and scrubbed throughout the case, critical for completion in a timely fashion, and for retraction, instrumentation, closure.

## 2019-10-21 NOTE — Progress Notes (Signed)
No pre op labs needed per Dr Oddono 

## 2019-10-21 NOTE — Plan of Care (Signed)
  Problem: Clinical Measurements: Goal: Ability to avoid or minimize complications of infection will improve Outcome: Progressing   Problem: Education: Goal: Knowledge of General Education information will improve Description: Including pain rating scale, medication(s)/side effects and non-pharmacologic comfort measures Outcome: Progressing   Problem: Health Behavior/Discharge Planning: Goal: Ability to manage health-related needs will improve Outcome: Progressing   Problem: Clinical Measurements: Goal: Ability to maintain clinical measurements within normal limits will improve Outcome: Progressing Goal: Will remain free from infection Outcome: Progressing Goal: Respiratory complications will improve Outcome: Progressing Goal: Cardiovascular complication will be avoided Outcome: Progressing   Problem: Activity: Goal: Risk for activity intolerance will decrease Outcome: Progressing   Problem: Nutrition: Goal: Adequate nutrition will be maintained Outcome: Progressing   Problem: Coping: Goal: Level of anxiety will decrease Outcome: Progressing   Problem: Elimination: Goal: Will not experience complications related to bowel motility Outcome: Progressing   Problem: Safety: Goal: Ability to remain free from injury will improve Outcome: Progressing   Problem: Skin Integrity: Goal: Risk for impaired skin integrity will decrease Outcome: Progressing

## 2019-10-21 NOTE — Progress Notes (Addendum)
Pharmacy Antibiotic Note  Tyler King is a 29 y.o. male admitted on 10/20/2019 with R septic knee. He has failed multiple rounds of antibiotics and symptoms are worsening; pt is S/P I&D of abscess and prepatellar bursectomy today. Pharmacy has been consulted for Zosyn dosing.  Plan: Continue Zosyn 3.375 gm IV Q 8 hrs (extended infusion) Monitor WBC, temp, clinical improvement, renal function  Estimated Creatinine Clearance: 156.8 mL/min (by C-G formula based on SCr of 0.98 mg/dL).    No Known Allergies  Antimicrobials this admission: 10/5 Zosyn >> 10/5 vancomycin X 1  10/6 cefazolin peri op  Microbiology results: 10/5 COVID, flu A, flu B: all negative  Thank you for allowing pharmacy to be a part of this patient's care.  Vicki Mallet, PharmD, BCPS, [facility] Clinical Pharmacist 10/21/2019 5:21 PM

## 2019-10-21 NOTE — Anesthesia Procedure Notes (Signed)
Procedure Name: LMA Insertion Date/Time: 10/21/2019 2:39 PM Performed by: Lovie Chol, CRNA Pre-anesthesia Checklist: Patient identified, Emergency Drugs available, Suction available and Patient being monitored Patient Re-evaluated:Patient Re-evaluated prior to induction Oxygen Delivery Method: Circle System Utilized Preoxygenation: Pre-oxygenation with 100% oxygen Induction Type: IV induction Ventilation: Mask ventilation without difficulty LMA: LMA inserted LMA Size: 4.0 Number of attempts: 1 Airway Equipment and Method: Bite block Placement Confirmation: positive ETCO2 and breath sounds checked- equal and bilateral Tube secured with: Tape Dental Injury: Teeth and Oropharynx as per pre-operative assessment

## 2019-10-21 NOTE — Anesthesia Postprocedure Evaluation (Signed)
Anesthesia Post Note  Patient: Tyler King  Procedure(s) Performed: IRRIGATION AND DEBRIDEMENT WOUND AND PREPATELLAR BURSECTOMY (Right Knee)     Patient location during evaluation: PACU Anesthesia Type: General Level of consciousness: awake and alert Pain management: pain level controlled Vital Signs Assessment: post-procedure vital signs reviewed and stable Respiratory status: spontaneous breathing, nonlabored ventilation, respiratory function stable and patient connected to nasal cannula oxygen Cardiovascular status: blood pressure returned to baseline and stable Postop Assessment: no apparent nausea or vomiting Anesthetic complications: no   No complications documented.  Last Vitals:  Vitals:   10/21/19 1550 10/21/19 1557  BP:  99/63  Pulse: 61 61  Resp: 15 15  Temp:    SpO2: 97% 97%    Last Pain:  Vitals:   10/21/19 1546  TempSrc:   PainSc: 6                  Mancil Pfenning

## 2019-10-21 NOTE — Transfer of Care (Signed)
Immediate Anesthesia Transfer of Care Note  Patient: Tyler King  Procedure(s) Performed: IRRIGATION AND DEBRIDEMENT WOUND AND PREPATELLAR BURSECTOMY (Right Knee)  Patient Location: PACU  Anesthesia Type:General  Level of Consciousness: awake and oriented  Airway & Oxygen Therapy: Patient Spontanous Breathing  Post-op Assessment: Report given to RN  Post vital signs: Reviewed and stable  Last Vitals:  Vitals Value Taken Time  BP    Temp    Pulse    Resp    SpO2      Last Pain:  Vitals:   10/21/19 0445  TempSrc: Oral  PainSc:          Complications: No complications documented.

## 2019-10-21 NOTE — Interval H&P Note (Signed)
Patient did not have a significant enough a lot of improvement and we decided to proceed with surgery, all questions answered.

## 2019-10-22 ENCOUNTER — Encounter (HOSPITAL_COMMUNITY): Payer: Self-pay | Admitting: Orthopaedic Surgery

## 2019-10-22 LAB — SURGICAL PATHOLOGY

## 2019-10-22 LAB — BASIC METABOLIC PANEL
Anion gap: 8 (ref 5–15)
BUN: 10 mg/dL (ref 6–20)
CO2: 25 mmol/L (ref 22–32)
Calcium: 8.6 mg/dL — ABNORMAL LOW (ref 8.9–10.3)
Chloride: 107 mmol/L (ref 98–111)
Creatinine, Ser: 1.07 mg/dL (ref 0.61–1.24)
GFR calc non Af Amer: >60 mL/min (ref >60)
Glucose, Bld: 101 mg/dL — ABNORMAL HIGH (ref 70–99)
Potassium: 3.8 mmol/L (ref 3.5–5.1)
Sodium: 140 mmol/L (ref 135–145)

## 2019-10-22 LAB — CBC
HCT: 34.7 % — ABNORMAL LOW (ref 39.0–52.0)
Hemoglobin: 11.5 g/dL — ABNORMAL LOW (ref 13.0–17.0)
MCH: 28.3 pg (ref 26.0–34.0)
MCHC: 33.1 g/dL (ref 30.0–36.0)
MCV: 85.3 fL (ref 80.0–100.0)
Platelets: 207 10*3/uL (ref 150–400)
RBC: 4.07 MIL/uL — ABNORMAL LOW (ref 4.22–5.81)
RDW: 11.3 % — ABNORMAL LOW (ref 11.5–15.5)
WBC: 10.3 10*3/uL (ref 4.0–10.5)
nRBC: 0 % (ref 0.0–0.2)

## 2019-10-22 MED ORDER — CELECOXIB 200 MG PO CAPS: 200.0000 mg | ORAL_CAPSULE | Freq: Two times a day (BID) | ORAL | 0 refills | Status: AC

## 2019-10-22 MED ORDER — ONDANSETRON HCL 4 MG PO TABS: 4.0000 mg | ORAL_TABLET | Freq: Three times a day (TID) | ORAL | 1 refills | Status: AC | PRN

## 2019-10-22 MED ORDER — ASPIRIN 81 MG PO CHEW: 81.0000 mg | CHEWABLE_TABLET | Freq: Two times a day (BID) | ORAL | 0 refills | Status: AC

## 2019-10-22 MED ORDER — SULFAMETHOXAZOLE-TRIMETHOPRIM 800-160 MG PO TABS: 1.0000 | ORAL_TABLET | Freq: Two times a day (BID) | ORAL | 0 refills | Status: AC

## 2019-10-22 MED ORDER — OMEPRAZOLE 20 MG PO CPDR: 20.0000 mg | DELAYED_RELEASE_CAPSULE | Freq: Every day | ORAL | 0 refills | Status: AC

## 2019-10-22 MED ORDER — ACETAMINOPHEN 500 MG PO TABS: 1000.0000 mg | ORAL_TABLET | Freq: Three times a day (TID) | ORAL | 0 refills | Status: AC

## 2019-10-22 MED ORDER — METHOCARBAMOL 750 MG PO TABS: 750.0000 mg | ORAL_TABLET | Freq: Three times a day (TID) | ORAL | 0 refills | Status: AC | PRN

## 2019-10-22 MED ORDER — GABAPENTIN 300 MG PO CAPS: 300.0000 mg | ORAL_CAPSULE | Freq: Two times a day (BID) | ORAL | 1 refills | Status: AC

## 2019-10-22 NOTE — Progress Notes (Signed)
   ORTHOPAEDIC PROGRESS NOTE  s/p Procedure(s): IRRIGATION AND DEBRIDEMENT WOUND AND PREPATELLAR BURSECTOMY on 10/21/2019 with Dr. Everardo Pacific  SUBJECTIVE: Patient had increased amount of pain last night. His Toradol was increased. He feels better today, but still having some pain in his knee. Leg feels swollen. Tolerating diet. No chest pain. No SOB. No nausea/vomiting. No other complaints.  OBJECTIVE: PE: General: sleeping in hospital bed, no acute distress Cardiac: regular rate Pulmonary: no increased work of breathing GI: abdomen soft, non-tender Right lower extremity: Incision with nylon sutures and Iodoform packing in place. Approximately 2 inches was removed today. Non-tender to palpation right knee. Swelling of right leg. Compartments compressible, no pain with passive stretch. Intact EHL/TA/GSC. Endorses distal sensation. Warm well perfused foot.    Vitals:   10/22/19 0230 10/22/19 0520  BP: 138/74 132/80  Pulse: 68 72  Resp: 18 18  Temp: (!) 97.3 F (36.3 C) 98 F (36.7 C)  SpO2: 98% 98%     ASSESSMENT: Tyler King is a 29 y.o. male doing well postoperatively. POD#1  PLAN: Weightbearing: WBAT RLE in knee immobilizer to protect incision. Discussed avoiding flexion greater than 90 degrees. Insicional and dressing care: Patient to remove about 1.5 inches of packing daily. Keep incisions CDI. Reinforce daily. Orthopedic device(s): Knee immobilizer Showering: Post-op day #3 VTE prophylaxis: Aspirin Pain control: Patient on suboxone (avoid narcotics)  1. Tylenol 1000 mg every 8 hours  2. Celebrex 200 mg BID  3. Gabapentin 300 mg TID  4. Robaxin 500 mg every 8 hours as needed for muscle spasms Follow - up plan: 1 week in office with Dr. Salomon Fick information: Dr. Ramond Marrow, Alfonse Alpers, PA-C. After hours and holidays please check [email or web address] for group call information for [facility]  Plan to discharge home today with his wife. F/u next week. We will  follow cultures closely.   Alfonse Alpers, PA-C 10/22/2019

## 2019-10-22 NOTE — Progress Notes (Signed)
Pt able to walk around in his room with knee immobilizer on and tolerated fairly well. Pt and wife feel he is ready to go home.  Discharge instructions reviewed with pt and his wife.  Copy of instructions given to pt, scripts sent to his pharmacy. HOME med stored in hospital pharmacy returned to pt.  Pt to have wound care/dressing changes at home, wife states she is comfortable doing so, wife is a Engineer, civil (consulting) she states, supplies provided.  Pt d/c'd via wheelchair with belongings, with wife.             Escorted by unit NT.

## 2019-10-22 NOTE — Discharge Summary (Signed)
Patient ID: Tyler King MRN: [id number] DOB/AGE: [birth year] 29 y.o.  Admit date: 10/20/2019 Discharge date: 10/22/2019  Admission Diagnoses: Right septic prepatellar bursitis  Discharge Diagnoses:  Active Problems:   Septic prepatellar bursitis of right knee   History reviewed. No pertinent past medical history.   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 He has been treated with Augmentin Prednisone, Doxycycline and Keflex, IM Rocephin, and Moxifloxacin. He has had no improvements of his symptoms. His knee is very painful. He failed non-operative treatment. He was admitted to the hospital for IV antibiotics and surgery. He underwent surgery on 10/21/2019. He tolerated procedure well.  He was kept for monitoring overnight for pain control, IV antibiotics, and medical monitoring postop. He was found to be stable for DC home the morning after surgery.  Patient was instructed on specific activity restrictions and all questions were answered.  Consults: None  Significant Diagnostic Studies: Cultures  Treatments: Surgery  Discharge Exam: General: sleeping in hospital bed, no acute distress Cardiac: regular rate Pulmonary: no increased work of breathing GI: abdomen soft, non-tender Right lower extremity: Incision with nylon sutures and Iodoform packing in place. Approximately 2 inches was removed today. Non-tender to palpation right knee. Swelling of right leg. Compartments compressible, no pain with passive stretch. Intact EHL/TA/GSC. Endorses distal sensation. Warm well perfused foot.   Disposition: Discharge disposition: 01-Home or Self Care     Dispo: Home with wife. I went over discharge instructions with the patient and his wife in detail. Patient and his wife comfortable with discharge  plan. They will follow-up in office next week.   Discharge Instructions    Call MD for:  redness, tenderness, or signs of infection (pain, swelling, redness, odor or green/yellow discharge around incision site)   Complete by: As directed    Call MD for:  severe uncontrolled pain   Complete by: As directed    Call MD for:  temperature >100.4   Complete by: As directed    Diet - low sodium heart healthy   Complete by: As directed    Discharge instructions   Complete by: As directed    Ramond Marrow MD, MPH Alfonse Alpers, PA-C [facility] [address] [phone] (tel)   [phone] (fax)   POST-OPERATIVE INSTRUCTIONS - Knee   WOUND CARE - Remove your operative dressing daily. - Remove about 1.5 inches of packing from the wound daily - Redress with gauze, and an ACE wrap - An ACE wrap may be used to control swelling, do not wrap this too tight.  If the initial ACE wrap feels too tight you may loosen it. - There may be a small amount of fluid/bleeding leaking at the surgical site. This is normal.  - Use the Cryocuff or Ice as often as possible for the first 7 days, then as needed for pain relief. Always keep a towel, ACE wrap or other barrier between the cooling unit and your skin.  - You may shower on Post-Op Day #3. Gently pat the area dry. Do not soak the knee in water or submerge it.  - Keep the incision as clean and dry as possible -   Do not go swimming in the pool or ocean until 4 weeks after surgery or when otherwise instructed.  Keep dry incisions as dry as possible.  BRACE/AMBULATION -  You have a knee immobilizer to wear while you are weight bearing - this is to protect your incision -  Do not bend your knee more than 90 degrees. This will put a lot of tension on your stitches. - You may put full weight on your operative leg as you tolerate. - Keep your leg elevated to help with the swelling.   REGIONAL ANESTHESIA (NERVE BLOCKS) -  The anesthesia team may have performed a nerve block for you if safe in the setting of your care.  This is a great tool used to minimize pain.  Typically the block may start wearing off overnight.  This can be a challenging period but please utilize your as needed pain medications to try and manage this period and know it will be a brief transition as the nerve block wears completely   POST-OP MEDICATIONS - Multimodal approach to pain control - In general your pain will be controlled with a combination of substances.  Prescriptions unless otherwise discussed are electronically sent to your pharmacy.  This is a carefully made plan we use to minimize narcotic use.     - Meloxicam OR Celebrex - Anti-inflammatory medication taken on a scheduled basis - Acetaminophen - Non-narcotic pain medicine taken on a scheduled basis  - Gabapentin - this medication helps with pain. Take on a scheduled basis - Robaxin - this is a muscle relaxer - you may take as needed - Aspirin 81mg  - This medicine is used to minimize the risk of blood clots after surgery.   Take 1 tablet twice a day for 6 weeks - Omeprazole -daily medicine to protect your stomach while taking anti-inflammatories.   -  Zofran - take as needed for nausea - Bactrim - this is an antibiotic take as directed.    - we will follow the samples we took from the operating room for growth of bacteria   - we will call you if we need to change your antibiotic   FOLLOW-UP   Please call the office to schedule a follow-up appointment for your incision check, 7-10 days post-operatively.  IF YOU HAVE ANY QUESTIONS, PLEASE FEEL FREE TO CALL OUR OFFICE. -  *If you have any wound concerns please call the office!!!*   HELPFUL INFORMATION  - If you had a block, it will wear off between 8-24 hrs postop typically.  This is period when your pain may go from nearly zero to the pain you would have had post-op without the block.  This is an abrupt transition but  nothing dangerous is happening.     Keep your leg elevated to decrease swelling, which will then in turn decrease your pain. I would elevate the foot of your bed by putting a couple of couch pillows between your mattress and box spring. I would not keep pillow directly under your ankle.  - Do not sleep with a pillow behind your knee even if it is more comfortable as this may make it harder to get your knee fully straight long term.   There will be MORE swelling on days 1-3 than there is on the day of surgery.  This also is normal. The swelling will decrease with the anti-inflammatory medication, ice and keeping it elevated. The swelling will make it more difficult to bend your knee. As the swelling goes down your motion will become easier   You may develop swelling and bruising that extends from your knee down to your calf and perhaps even  to your foot over the next week. Do not be alarmed. This too is normal, and it is due to gravity   There may be some numbness adjacent to the incision site. This may last for 6-12 months or longer in some patients and is expected.   You may return to sedentary work/school in the next couple of days when you feel up to it. You will need to keep your leg elevated as much as possible   Increase activity slowly   Complete by: As directed      Allergies as of 10/22/2019   No Known Allergies     Medication List    STOP taking these medications   ibuprofen 200 MG tablet Commonly known as: ADVIL     TAKE these medications   acetaminophen 500 MG tablet Commonly known as: TYLENOL Take 2 tablets (1,000 mg total) by mouth every 8 (eight) hours for 14 days.   aspirin 81 MG chewable tablet Commonly known as: Aspirin Childrens Chew 1 tablet (81 mg total) by mouth 2 (two) times daily. For 6 weeks for DVT prophylaxis after surgery   Buprenorphine HCl-Naloxone HCl 8-2 MG Film Place 1.5 Film under the tongue daily.   celecoxib 200 MG capsule Commonly  known as: CeleBREX Take 1 capsule (200 mg total) by mouth 2 (two) times daily.   gabapentin 300 MG capsule Commonly known as: Neurontin Take 1 capsule (300 mg total) by mouth 2 (two) times daily for 14 days. For 2 weeks post op for pain.   methocarbamol 750 MG tablet Commonly known as: ROBAXIN Take 1 tablet (750 mg total) by mouth every 8 (eight) hours as needed for muscle spasms.   omeprazole 20 MG capsule Commonly known as: PriLOSEC Take 1 capsule (20 mg total) by mouth daily. 30 days for gastroprotection while taking NSAIDs.   ondansetron 4 MG tablet Commonly known as: Zofran Take 1 tablet (4 mg total) by mouth every 8 (eight) hours as needed for up to 7 days for nausea or vomiting.   sulfamethoxazole-trimethoprim 800-160 MG tablet Commonly known as: BACTRIM DS Take 1 tablet by mouth 2 (two) times daily for 14 days.

## 2019-11-11 DIAGNOSIS — F1121 Opioid dependence, in remission: Secondary | ICD-10-CM | POA: Diagnosis not present

## 2019-11-11 DIAGNOSIS — F411 Generalized anxiety disorder: Secondary | ICD-10-CM | POA: Diagnosis not present

## 2019-11-11 DIAGNOSIS — Z79899 Other long term (current) drug therapy: Secondary | ICD-10-CM | POA: Diagnosis not present

## 2019-11-11 DIAGNOSIS — Z6835 Body mass index (BMI) 35.0-35.9, adult: Secondary | ICD-10-CM | POA: Diagnosis not present

## 2019-11-13 DIAGNOSIS — M7041 Prepatellar bursitis, right knee: Secondary | ICD-10-CM | POA: Diagnosis not present

## 2019-12-09 DIAGNOSIS — Z6835 Body mass index (BMI) 35.0-35.9, adult: Secondary | ICD-10-CM | POA: Diagnosis not present

## 2019-12-09 DIAGNOSIS — Z79899 Other long term (current) drug therapy: Secondary | ICD-10-CM | POA: Diagnosis not present

## 2019-12-09 DIAGNOSIS — F1121 Opioid dependence, in remission: Secondary | ICD-10-CM | POA: Diagnosis not present

## 2019-12-09 DIAGNOSIS — F411 Generalized anxiety disorder: Secondary | ICD-10-CM | POA: Diagnosis not present

## 2020-01-06 DIAGNOSIS — F411 Generalized anxiety disorder: Secondary | ICD-10-CM | POA: Diagnosis not present

## 2020-01-06 DIAGNOSIS — F1121 Opioid dependence, in remission: Secondary | ICD-10-CM | POA: Diagnosis not present

## 2020-01-06 DIAGNOSIS — Z6835 Body mass index (BMI) 35.0-35.9, adult: Secondary | ICD-10-CM | POA: Diagnosis not present

## 2020-01-06 DIAGNOSIS — Z79899 Other long term (current) drug therapy: Secondary | ICD-10-CM | POA: Diagnosis not present

## 2020-02-03 DIAGNOSIS — Z6835 Body mass index (BMI) 35.0-35.9, adult: Secondary | ICD-10-CM | POA: Diagnosis not present

## 2020-02-03 DIAGNOSIS — F411 Generalized anxiety disorder: Secondary | ICD-10-CM | POA: Diagnosis not present

## 2020-02-03 DIAGNOSIS — Z79899 Other long term (current) drug therapy: Secondary | ICD-10-CM | POA: Diagnosis not present

## 2020-02-03 DIAGNOSIS — F1121 Opioid dependence, in remission: Secondary | ICD-10-CM | POA: Diagnosis not present

## 2020-03-02 DIAGNOSIS — Z6835 Body mass index (BMI) 35.0-35.9, adult: Secondary | ICD-10-CM | POA: Diagnosis not present

## 2020-03-02 DIAGNOSIS — F411 Generalized anxiety disorder: Secondary | ICD-10-CM | POA: Diagnosis not present

## 2020-03-02 DIAGNOSIS — Z79899 Other long term (current) drug therapy: Secondary | ICD-10-CM | POA: Diagnosis not present

## 2020-03-02 DIAGNOSIS — F1121 Opioid dependence, in remission: Secondary | ICD-10-CM | POA: Diagnosis not present

## 2020-03-30 DIAGNOSIS — Z6835 Body mass index (BMI) 35.0-35.9, adult: Secondary | ICD-10-CM | POA: Diagnosis not present

## 2020-03-30 DIAGNOSIS — F411 Generalized anxiety disorder: Secondary | ICD-10-CM | POA: Diagnosis not present

## 2020-03-30 DIAGNOSIS — F1121 Opioid dependence, in remission: Secondary | ICD-10-CM | POA: Diagnosis not present

## 2020-03-30 DIAGNOSIS — Z79899 Other long term (current) drug therapy: Secondary | ICD-10-CM | POA: Diagnosis not present

## 2020-04-27 DIAGNOSIS — Z79899 Other long term (current) drug therapy: Secondary | ICD-10-CM | POA: Diagnosis not present

## 2020-04-27 DIAGNOSIS — F411 Generalized anxiety disorder: Secondary | ICD-10-CM | POA: Diagnosis not present

## 2020-04-27 DIAGNOSIS — F1121 Opioid dependence, in remission: Secondary | ICD-10-CM | POA: Diagnosis not present

## 2020-04-27 DIAGNOSIS — Z6835 Body mass index (BMI) 35.0-35.9, adult: Secondary | ICD-10-CM | POA: Diagnosis not present

## 2020-05-26 DIAGNOSIS — F411 Generalized anxiety disorder: Secondary | ICD-10-CM | POA: Diagnosis not present

## 2020-05-26 DIAGNOSIS — F1121 Opioid dependence, in remission: Secondary | ICD-10-CM | POA: Diagnosis not present

## 2020-05-26 DIAGNOSIS — Z6835 Body mass index (BMI) 35.0-35.9, adult: Secondary | ICD-10-CM | POA: Diagnosis not present

## 2020-06-23 DIAGNOSIS — F1121 Opioid dependence, in remission: Secondary | ICD-10-CM | POA: Diagnosis not present

## 2020-06-23 DIAGNOSIS — Z79899 Other long term (current) drug therapy: Secondary | ICD-10-CM | POA: Diagnosis not present

## 2020-06-23 DIAGNOSIS — F411 Generalized anxiety disorder: Secondary | ICD-10-CM | POA: Diagnosis not present

## 2020-07-25 DIAGNOSIS — F1121 Opioid dependence, in remission: Secondary | ICD-10-CM | POA: Diagnosis not present

## 2020-07-25 DIAGNOSIS — F411 Generalized anxiety disorder: Secondary | ICD-10-CM | POA: Diagnosis not present

## 2020-07-25 DIAGNOSIS — Z79899 Other long term (current) drug therapy: Secondary | ICD-10-CM | POA: Diagnosis not present

## 2020-07-25 DIAGNOSIS — Z6835 Body mass index (BMI) 35.0-35.9, adult: Secondary | ICD-10-CM | POA: Diagnosis not present

## 2020-08-17 DIAGNOSIS — Z20822 Contact with and (suspected) exposure to covid-19: Secondary | ICD-10-CM | POA: Diagnosis not present

## 2020-08-19 DIAGNOSIS — F411 Generalized anxiety disorder: Secondary | ICD-10-CM | POA: Diagnosis not present

## 2020-08-19 DIAGNOSIS — Z79899 Other long term (current) drug therapy: Secondary | ICD-10-CM | POA: Diagnosis not present

## 2020-08-19 DIAGNOSIS — F1121 Opioid dependence, in remission: Secondary | ICD-10-CM | POA: Diagnosis not present

## 2020-09-15 DIAGNOSIS — F1120 Opioid dependence, uncomplicated: Secondary | ICD-10-CM | POA: Diagnosis not present

## 2020-09-15 DIAGNOSIS — F411 Generalized anxiety disorder: Secondary | ICD-10-CM | POA: Diagnosis not present

## 2020-09-15 DIAGNOSIS — Z79899 Other long term (current) drug therapy: Secondary | ICD-10-CM | POA: Diagnosis not present

## 2020-10-06 DIAGNOSIS — Z0189 Encounter for other specified special examinations: Secondary | ICD-10-CM | POA: Diagnosis not present

## 2020-10-26 DIAGNOSIS — F1910 Other psychoactive substance abuse, uncomplicated: Secondary | ICD-10-CM | POA: Diagnosis not present

## 2020-10-26 DIAGNOSIS — Z79899 Other long term (current) drug therapy: Secondary | ICD-10-CM | POA: Diagnosis not present

## 2020-10-26 DIAGNOSIS — F411 Generalized anxiety disorder: Secondary | ICD-10-CM | POA: Diagnosis not present

## 2020-10-26 DIAGNOSIS — I10 Essential (primary) hypertension: Secondary | ICD-10-CM | POA: Diagnosis not present

## 2020-10-26 DIAGNOSIS — F1120 Opioid dependence, uncomplicated: Secondary | ICD-10-CM | POA: Diagnosis not present

## 2020-11-23 DIAGNOSIS — F1120 Opioid dependence, uncomplicated: Secondary | ICD-10-CM | POA: Diagnosis not present

## 2020-11-23 DIAGNOSIS — Z79899 Other long term (current) drug therapy: Secondary | ICD-10-CM | POA: Diagnosis not present

## 2020-11-23 DIAGNOSIS — F411 Generalized anxiety disorder: Secondary | ICD-10-CM | POA: Diagnosis not present

## 2020-12-21 DIAGNOSIS — F411 Generalized anxiety disorder: Secondary | ICD-10-CM | POA: Diagnosis not present

## 2020-12-21 DIAGNOSIS — Z79899 Other long term (current) drug therapy: Secondary | ICD-10-CM | POA: Diagnosis not present

## 2020-12-21 DIAGNOSIS — F1120 Opioid dependence, uncomplicated: Secondary | ICD-10-CM | POA: Diagnosis not present

## 2020-12-21 DIAGNOSIS — F1910 Other psychoactive substance abuse, uncomplicated: Secondary | ICD-10-CM | POA: Diagnosis not present

## 2020-12-21 DIAGNOSIS — D485 Neoplasm of uncertain behavior of skin: Secondary | ICD-10-CM | POA: Diagnosis not present

## 2020-12-21 DIAGNOSIS — L940 Localized scleroderma [morphea]: Secondary | ICD-10-CM | POA: Diagnosis not present

## 2020-12-21 DIAGNOSIS — I10 Essential (primary) hypertension: Secondary | ICD-10-CM | POA: Diagnosis not present

## 2021-01-13 DIAGNOSIS — Z20828 Contact with and (suspected) exposure to other viral communicable diseases: Secondary | ICD-10-CM | POA: Diagnosis not present

## 2021-01-13 DIAGNOSIS — J039 Acute tonsillitis, unspecified: Secondary | ICD-10-CM | POA: Diagnosis not present

## 2021-01-13 DIAGNOSIS — H6691 Otitis media, unspecified, right ear: Secondary | ICD-10-CM | POA: Diagnosis not present

## 2021-01-18 ENCOUNTER — Other Ambulatory Visit (INDEPENDENT_AMBULATORY_CARE_PROVIDER_SITE_OTHER): Payer: Self-pay | Admitting: Sports Medicine

## 2021-01-18 DIAGNOSIS — Z025 Encounter for examination for participation in sport: Secondary | ICD-10-CM

## 2021-01-18 DIAGNOSIS — F1120 Opioid dependence, uncomplicated: Secondary | ICD-10-CM | POA: Diagnosis not present

## 2021-01-18 DIAGNOSIS — F411 Generalized anxiety disorder: Secondary | ICD-10-CM | POA: Diagnosis not present

## 2021-01-18 DIAGNOSIS — Z79899 Other long term (current) drug therapy: Secondary | ICD-10-CM | POA: Diagnosis not present

## 2021-01-18 DIAGNOSIS — J0100 Acute maxillary sinusitis, unspecified: Secondary | ICD-10-CM | POA: Diagnosis not present

## 2021-01-18 DIAGNOSIS — H6691 Otitis media, unspecified, right ear: Secondary | ICD-10-CM | POA: Diagnosis not present

## 2021-01-20 ENCOUNTER — Encounter (INDEPENDENT_AMBULATORY_CARE_PROVIDER_SITE_OTHER): Payer: Self-pay | Admitting: Sports Medicine

## 2021-01-20 ENCOUNTER — Encounter (FREE_STANDING_LABORATORY_FACILITY): Payer: Commercial Managed Care - POS

## 2021-01-20 DIAGNOSIS — Z025 Encounter for examination for participation in sport: Secondary | ICD-10-CM

## 2021-01-20 DIAGNOSIS — Z0000 Encounter for general adult medical examination without abnormal findings: Secondary | ICD-10-CM

## 2021-01-20 LAB — COMPREHENSIVE METABOLIC PANEL
ALT: 29 U/L (ref 0–55)
AST (SGOT): 22 U/L (ref 5–41)
Albumin/Globulin Ratio: 2 (ref 0.9–2.2)
Albumin: 4.9 g/dL (ref 3.5–5.0)
Alkaline Phosphatase: 53 U/L (ref 37–117)
Anion Gap: 6 (ref 5.0–15.0)
BUN: 13 mg/dL (ref 9.0–28.0)
Bilirubin, Total: 2.1 mg/dL — ABNORMAL HIGH (ref 0.2–1.2)
CO2: 31 mEq/L — ABNORMAL HIGH (ref 17–29)
Calcium: 9.8 mg/dL (ref 8.5–10.5)
Chloride: 102 mEq/L (ref 99–111)
Creatinine: 0.9 mg/dL (ref 0.5–1.5)
Globulin: 2.5 g/dL (ref 2.0–3.6)
Glucose: 95 mg/dL (ref 70–100)
Potassium: 4.6 mEq/L (ref 3.5–5.3)
Protein, Total: 7.4 g/dL (ref 6.0–8.3)
Sodium: 139 mEq/L (ref 135–145)

## 2021-01-20 LAB — LIPID PANEL
Cholesterol / HDL Ratio: 2.8 Index
Cholesterol: 180 mg/dL (ref 0–199)
HDL: 64 mg/dL (ref 40–9999)
LDL Calculated: 105 mg/dL — ABNORMAL HIGH (ref 0–99)
Triglycerides: 55 mg/dL (ref 34–149)
VLDL Calculated: 11 mg/dL (ref 10–40)

## 2021-01-20 LAB — IRON PROFILE
Iron Saturation: 47 % (ref 15–50)
Iron: 121 ug/dL (ref 40–160)
TIBC: 256 ug/dL — ABNORMAL LOW (ref 261–462)
UIBC: 135 ug/dL (ref 126–382)

## 2021-01-20 LAB — CBC AND DIFFERENTIAL
Absolute NRBC: 0 10*3/uL (ref 0.00–0.00)
Basophils Absolute Automated: 0.06 10*3/uL (ref 0.00–0.08)
Basophils Automated: 1.2 %
Eosinophils Absolute Automated: 0.04 10*3/uL (ref 0.00–0.44)
Eosinophils Automated: 0.8 %
Hematocrit: 48.7 % (ref 37.6–49.6)
Hgb: 16.2 g/dL (ref 12.5–17.1)
Immature Granulocytes Absolute: 0.01 10*3/uL (ref 0.00–0.07)
Immature Granulocytes: 0.2 %
Lymphocytes Absolute Automated: 1.51 10*3/uL (ref 0.42–3.22)
Lymphocytes Automated: 29.8 %
MCH: 29.7 pg (ref 25.1–33.5)
MCHC: 33.3 g/dL (ref 31.5–35.8)
MCV: 89.2 fL (ref 78.0–96.0)
MPV: 10.4 fL (ref 8.9–12.5)
Monocytes Absolute Automated: 0.5 10*3/uL (ref 0.21–0.85)
Monocytes: 9.9 %
Neutrophils Absolute: 2.94 10*3/uL (ref 1.10–6.33)
Neutrophils: 58.1 %
Nucleated RBC: 0 /100 WBC (ref 0.0–0.0)
Platelets: 185 10*3/uL (ref 142–346)
RBC: 5.46 10*6/uL (ref 4.20–5.90)
RDW: 13 % (ref 11–15)
WBC: 5.06 10*3/uL (ref 3.10–9.50)

## 2021-01-20 LAB — URINALYSIS REFLEX TO MICROSCOPIC EXAM - REFLEX TO CULTURE
Bilirubin, UA: NEGATIVE
Blood, UA: NEGATIVE
Glucose, UA: NEGATIVE
Ketones UA: NEGATIVE
Leukocyte Esterase, UA: NEGATIVE
Nitrite, UA: NEGATIVE
Protein, UR: NEGATIVE
Specific Gravity UA: 1.018 (ref 1.001–1.035)
Urine pH: 6.5 (ref 5.0–8.0)
Urobilinogen, UA: NORMAL mg/dL

## 2021-01-20 LAB — HIV-1/2 AG/AB 4TH GEN. W/ REFLEX: HIV Ag/Ab, 4th Generation: NONREACTIVE

## 2021-01-20 LAB — HEPATITIS B CORE ANTIBODY, TOTAL: Hepatitis B Core Total AB: NONREACTIVE

## 2021-01-20 LAB — TSH: TSH: 1.03 u[IU]/mL (ref 0.35–4.94)

## 2021-01-20 LAB — SICKLE CELL SCREEN: Sickle Screen Test: NEGATIVE

## 2021-01-20 LAB — GFR: EGFR: >60

## 2021-01-20 LAB — RUBELLA ANTIBODY, IGG: Rubella AB, IgG: 7.75 Index

## 2021-01-20 LAB — T3: T3: 0.8 ng/mL (ref 0.48–1.59)

## 2021-01-20 LAB — VITAMIN D,25 OH,TOTAL: Vitamin D, 25 OH, Total: 59 ng/mL (ref 30–100)

## 2021-01-20 LAB — MUMPS ANTIBODY, IGG: Mumps Ab, IgG: 172 AU/mL

## 2021-01-20 LAB — FERRITIN: Ferritin: 138.9 ng/mL (ref 21.80–274.70)

## 2021-01-20 LAB — HEMOLYSIS INDEX: Hemolysis Index: 17 Index (ref 0–24)

## 2021-01-20 LAB — HEPATITIS C ANTIBODY: Hepatitis C, AB: NONREACTIVE

## 2021-01-20 LAB — HEPATITIS B SURFACE ANTIBODY: HEPATITIS B SURFACE ANTIBODY: 188.98 m[IU]/mL

## 2021-01-20 LAB — HEPATITIS A AB, IGG: Hepatitis A Ab, IGG: REACTIVE — AB

## 2021-01-20 LAB — VITAMIN B12: Vitamin B-12: 1058 pg/mL — ABNORMAL HIGH (ref 211–911)

## 2021-01-20 LAB — HEPATITIS B SURFACE ANTIGEN W/ REFLEX TO CONFIRMATION: Hepatitis B Surface Antigen: NONREACTIVE

## 2021-01-20 LAB — RUBEOLA ANTIBODY IGG: Rubeola (Measles), IgG: >300 AU/mL

## 2021-01-20 LAB — T4: T4: 6.6 ug/dL (ref 4.9–11.7)

## 2021-02-01 ENCOUNTER — Encounter (INDEPENDENT_AMBULATORY_CARE_PROVIDER_SITE_OTHER): Payer: Self-pay

## 2021-02-15 DIAGNOSIS — F411 Generalized anxiety disorder: Secondary | ICD-10-CM | POA: Diagnosis not present

## 2021-02-15 DIAGNOSIS — Z79899 Other long term (current) drug therapy: Secondary | ICD-10-CM | POA: Diagnosis not present

## 2021-02-15 DIAGNOSIS — F1120 Opioid dependence, uncomplicated: Secondary | ICD-10-CM | POA: Diagnosis not present

## 2021-03-16 DIAGNOSIS — F1910 Other psychoactive substance abuse, uncomplicated: Secondary | ICD-10-CM | POA: Diagnosis not present

## 2021-03-16 DIAGNOSIS — Z79899 Other long term (current) drug therapy: Secondary | ICD-10-CM | POA: Diagnosis not present

## 2021-03-16 DIAGNOSIS — F1120 Opioid dependence, uncomplicated: Secondary | ICD-10-CM | POA: Diagnosis not present

## 2021-03-16 DIAGNOSIS — F411 Generalized anxiety disorder: Secondary | ICD-10-CM | POA: Diagnosis not present

## 2021-03-16 DIAGNOSIS — I10 Essential (primary) hypertension: Secondary | ICD-10-CM | POA: Diagnosis not present

## 2021-03-30 DIAGNOSIS — L03115 Cellulitis of right lower limb: Secondary | ICD-10-CM | POA: Diagnosis not present

## 2021-04-13 DIAGNOSIS — F1120 Opioid dependence, uncomplicated: Secondary | ICD-10-CM | POA: Diagnosis not present

## 2021-04-13 DIAGNOSIS — L02415 Cutaneous abscess of right lower limb: Secondary | ICD-10-CM | POA: Diagnosis not present

## 2021-04-13 DIAGNOSIS — F411 Generalized anxiety disorder: Secondary | ICD-10-CM | POA: Diagnosis not present

## 2021-05-17 DIAGNOSIS — F1910 Other psychoactive substance abuse, uncomplicated: Secondary | ICD-10-CM | POA: Diagnosis not present

## 2021-05-17 DIAGNOSIS — I10 Essential (primary) hypertension: Secondary | ICD-10-CM | POA: Diagnosis not present

## 2021-05-17 DIAGNOSIS — F411 Generalized anxiety disorder: Secondary | ICD-10-CM | POA: Diagnosis not present

## 2021-05-17 DIAGNOSIS — F1120 Opioid dependence, uncomplicated: Secondary | ICD-10-CM | POA: Diagnosis not present

## 2021-05-17 DIAGNOSIS — Z79899 Other long term (current) drug therapy: Secondary | ICD-10-CM | POA: Diagnosis not present

## 2021-06-14 DIAGNOSIS — F411 Generalized anxiety disorder: Secondary | ICD-10-CM | POA: Diagnosis not present

## 2021-06-14 DIAGNOSIS — F330 Major depressive disorder, recurrent, mild: Secondary | ICD-10-CM | POA: Diagnosis not present

## 2021-06-14 DIAGNOSIS — F1120 Opioid dependence, uncomplicated: Secondary | ICD-10-CM | POA: Diagnosis not present

## 2021-07-12 DIAGNOSIS — F411 Generalized anxiety disorder: Secondary | ICD-10-CM | POA: Diagnosis not present

## 2021-07-12 DIAGNOSIS — F330 Major depressive disorder, recurrent, mild: Secondary | ICD-10-CM | POA: Diagnosis not present

## 2021-07-12 DIAGNOSIS — Z6832 Body mass index (BMI) 32.0-32.9, adult: Secondary | ICD-10-CM | POA: Diagnosis not present

## 2021-07-12 DIAGNOSIS — F1120 Opioid dependence, uncomplicated: Secondary | ICD-10-CM | POA: Diagnosis not present

## 2021-07-12 DIAGNOSIS — Z79899 Other long term (current) drug therapy: Secondary | ICD-10-CM | POA: Diagnosis not present

## 2021-08-09 DIAGNOSIS — F1120 Opioid dependence, uncomplicated: Secondary | ICD-10-CM | POA: Diagnosis not present

## 2021-08-09 DIAGNOSIS — F330 Major depressive disorder, recurrent, mild: Secondary | ICD-10-CM | POA: Diagnosis not present

## 2021-08-09 DIAGNOSIS — F411 Generalized anxiety disorder: Secondary | ICD-10-CM | POA: Diagnosis not present

## 2021-09-07 DIAGNOSIS — M545 Low back pain, unspecified: Secondary | ICD-10-CM | POA: Diagnosis not present

## 2021-09-08 DIAGNOSIS — F330 Major depressive disorder, recurrent, mild: Secondary | ICD-10-CM | POA: Diagnosis not present

## 2021-09-08 DIAGNOSIS — F1120 Opioid dependence, uncomplicated: Secondary | ICD-10-CM | POA: Diagnosis not present

## 2021-09-08 DIAGNOSIS — Z79899 Other long term (current) drug therapy: Secondary | ICD-10-CM | POA: Diagnosis not present

## 2021-09-08 DIAGNOSIS — Z6832 Body mass index (BMI) 32.0-32.9, adult: Secondary | ICD-10-CM | POA: Diagnosis not present

## 2021-09-08 DIAGNOSIS — F411 Generalized anxiety disorder: Secondary | ICD-10-CM | POA: Diagnosis not present

## 2021-09-10 ENCOUNTER — Ambulatory Visit
Admission: RE | Admit: 2021-09-10 | Discharge: 2021-09-10 | Disposition: A | Discharge status: Home / Self Care | Payer: 59 | Source: Ambulatory Visit | Attending: Sports Medicine | Admitting: Sports Medicine

## 2021-09-10 ENCOUNTER — Other Ambulatory Visit (INDEPENDENT_AMBULATORY_CARE_PROVIDER_SITE_OTHER): Payer: Self-pay | Admitting: Sports Medicine

## 2021-09-10 ENCOUNTER — Other Ambulatory Visit: Payer: Self-pay | Admitting: Sports Medicine

## 2021-09-10 DIAGNOSIS — S39011A Strain of muscle, fascia and tendon of abdomen, initial encounter: Secondary | ICD-10-CM

## 2021-09-10 MED ORDER — CELECOXIB 200 MG PO CAPS
200.0000 mg | ORAL_CAPSULE | Freq: Every day | ORAL | 0 refills | Status: DC
Start: 2021-09-10 — End: 2021-09-22

## 2021-09-10 NOTE — Progress Notes (Signed)
Did well with Celebrex previously with a similar injury. Will prescribe again for L oblique strain.

## 2021-09-10 NOTE — Progress Notes (Signed)
Player with left oblique injury. Noticed pain during warmups, particularly with twisting movements and kicking. Symptoms worsened throughout the game. Some pain with deep breaths. Vitals stable. Lungs CTAB, no CVAT, no guarding or rebound tn.    Will obtain MRI abd to further assess for oblique tear/strain.

## 2021-09-11 ENCOUNTER — Encounter (INDEPENDENT_AMBULATORY_CARE_PROVIDER_SITE_OTHER): Payer: Self-pay | Admitting: Sports Medicine

## 2021-09-12 ENCOUNTER — Encounter (INDEPENDENT_AMBULATORY_CARE_PROVIDER_SITE_OTHER): Payer: Self-pay

## 2021-09-12 NOTE — Progress Notes (Signed)
Per e-mail request of [employer]'s assistant athletic trainer Theone Murdoch, the following were e-mailed via [place] Confidential to [email or web address].   Dr. Annie Sable clinic note from 09/10/2021    MRI report from 09/10/2021

## 2021-09-15 DIAGNOSIS — M545 Low back pain, unspecified: Secondary | ICD-10-CM | POA: Diagnosis not present

## 2021-09-22 ENCOUNTER — Ambulatory Visit (INDEPENDENT_AMBULATORY_CARE_PROVIDER_SITE_OTHER): Payer: 59 | Admitting: Sports Medicine

## 2021-09-22 ENCOUNTER — Encounter (INDEPENDENT_AMBULATORY_CARE_PROVIDER_SITE_OTHER): Payer: Self-pay

## 2021-09-22 ENCOUNTER — Encounter (INDEPENDENT_AMBULATORY_CARE_PROVIDER_SITE_OTHER): Payer: Self-pay | Admitting: Sports Medicine

## 2021-09-22 VITALS — BP 117/73 | HR 68 | Resp 12

## 2021-09-22 DIAGNOSIS — S46011A Strain of muscle(s) and tendon(s) of the rotator cuff of right shoulder, initial encounter: Secondary | ICD-10-CM

## 2021-09-22 DIAGNOSIS — M25511 Pain in right shoulder: Secondary | ICD-10-CM

## 2021-09-22 MED ORDER — CELECOXIB 100 MG PO CAPS
100.0000 mg | ORAL_CAPSULE | Freq: Two times a day (BID) | ORAL | 0 refills | Status: AC
Start: 2021-09-22 — End: ?

## 2021-09-22 NOTE — Progress Notes (Unsigned)
[facility]       Provider: Zack Seal, MD  Date of Exam: 09/22/2021  Patient:  Christian Vargas  DOB: 1990/12/14  AGE: 31 y.o.  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 He thinks he injured it while diving to save a ball. His pain is diffuse and worse with abduction and at night when sleeping on the shoulder. There is no numbness or tingling. He denies a history of similar symptoms in the past. He is now here for further evaluation and discussion of treatment options.    EXAM:   BP 117/73   Pulse 68   Resp 12   Constitutional: Pt is well-developed, well-nourished. No distress.   Head: Normocephalic, atraumatic.   Skin: No rash visualized. Pt is not diaphoretic.   Psychiatric: Affect normal. Mood normal.    Right Shoulder  Observation:  normal; no atrophy; no prominence of AC joint or rounded shoulder posture  Range of Motion (elbow at side):  Forward flexion-elevation 180 Deg;  Abduction 180 Deg  Range of Motion (shoulder abducted to 90 deg):  External Rotation 90 Deg;  Internal Rotation 90 Deg   Palpation:  Acromioclavicular Joint - nontender                     Anterior rotator cuff - nontender                     Clavicle - nontender                     Biceps Tendon - mildly tender                     Soft tissues - nontender  Strength:  Abduction - R 4 / 5 (+pain); L 5 / 5                     External Rotation - R 4 / 5 ; L 5 / 5                     Tummy press - R 5 / 5 ; L 5 / 5                     Drop arm: Negative  Hawkin's Sign:  Positive  Neer's Sign:  Positive  Cross-arm adduction:  negative  Speed's Test:  Negative  Load and Shift:  symmetric  Sulcus Sign:  Negative  Obrien:  positive  Crank:  Positive  Ant Apprehension Test:  Negative  Relocation Test:  Negative   Upper extremity distal pulses:  normal  Upper extremity  sensory:  Normal    STUDIES:   None new      ASSESSMENT/PLAN:   1. Acute pain of right shoulder  celecoxib (CeleBREX) 100 MG capsule    MRI shoulder right without contrast      2. Traumatic tear of right rotator cuff, unspecified tear extent, initial encounter  celecoxib (CeleBREX) 100 MG capsule    MRI shoulder right without contrast        Christian Vargas is a very pleasant 31 y.o. male with acute right shoulder pain. There is clinical and historical evidence of rotator cuff tear, impingement,  labral pathology. We had a lengthy discussion about the diagnoses as well as the various treatment options for these problems.    Plan:  Relative rest and activity modification  ice / heat as needed for comfort  Over the counter Tylenol as needed  MRI right shoulder to further evaluate  Rx - Celebrex  Follow up once MRI complete or sooner as needed if symptoms worsen to discuss treatment options    Christian Dunk, MD [facility]  Primary Care Sports Medicine Physician  [facility]    ------------------------------------------------------------------------------------------------------------------------------  The review of the patient's medications does not in any way constitute an endorsement, by this clinician, of their use, dosage, indications, route, efficacy, interactions, or other clinical parameters.    This note was generated within the [facility] EMR and may contain inherent errors or omissions not intended by the user. Grammatical and punctuation errors, random word insertions, deletions, pronoun errors and incomplete sentences are occasional consequences of this technology due to software limitations. Not all errors are caught or corrected.  Although every attempt is made to root out erroneus and incomplete transcription, the note may still not fully represent the intent or opinion of the author. If there are questions or concerns about the content of this note or information contained within the body of this  dictation they should be addressed directly with the author for clarification.

## 2021-09-23 ENCOUNTER — Ambulatory Visit
Admission: RE | Admit: 2021-09-23 | Discharge: 2021-09-23 | Disposition: A | Discharge status: Home / Self Care | Payer: 59 | Source: Ambulatory Visit | Attending: Sports Medicine | Admitting: Sports Medicine

## 2021-09-23 ENCOUNTER — Other Ambulatory Visit (INDEPENDENT_AMBULATORY_CARE_PROVIDER_SITE_OTHER): Payer: Self-pay | Admitting: Sports Medicine

## 2021-09-23 ENCOUNTER — Other Ambulatory Visit: Payer: Self-pay | Admitting: Sports Medicine

## 2021-09-23 DIAGNOSIS — M25511 Pain in right shoulder: Secondary | ICD-10-CM

## 2021-09-25 ENCOUNTER — Encounter (INDEPENDENT_AMBULATORY_CARE_PROVIDER_SITE_OTHER): Payer: Self-pay | Admitting: Sports Medicine

## 2021-09-28 ENCOUNTER — Encounter (INDEPENDENT_AMBULATORY_CARE_PROVIDER_SITE_OTHER): Payer: Self-pay

## 2021-09-28 NOTE — Progress Notes (Signed)
Per e-mail request of [employer]'s assistant athletic trainer Theone Murdoch, Dr. Annie Sable clinic note from 09/22/21 and MRI reports from 09/10/21 and 09/23/2021 were e-mailed via [place] Confidential to [email or web address].

## 2021-10-04 ENCOUNTER — Encounter (INDEPENDENT_AMBULATORY_CARE_PROVIDER_SITE_OTHER): Payer: Self-pay | Admitting: Sports Medicine

## 2021-10-10 DIAGNOSIS — F411 Generalized anxiety disorder: Secondary | ICD-10-CM | POA: Diagnosis not present

## 2021-10-10 DIAGNOSIS — F1120 Opioid dependence, uncomplicated: Secondary | ICD-10-CM | POA: Diagnosis not present

## 2021-10-10 DIAGNOSIS — F330 Major depressive disorder, recurrent, mild: Secondary | ICD-10-CM | POA: Diagnosis not present

## 2021-10-12 ENCOUNTER — Encounter (INDEPENDENT_AMBULATORY_CARE_PROVIDER_SITE_OTHER): Payer: Self-pay | Admitting: Sports Medicine

## 2021-11-14 DIAGNOSIS — F411 Generalized anxiety disorder: Secondary | ICD-10-CM | POA: Diagnosis not present

## 2021-11-14 DIAGNOSIS — F1120 Opioid dependence, uncomplicated: Secondary | ICD-10-CM | POA: Diagnosis not present

## 2021-11-14 DIAGNOSIS — F330 Major depressive disorder, recurrent, mild: Secondary | ICD-10-CM | POA: Diagnosis not present

## 2021-11-27 ENCOUNTER — Ambulatory Visit (INDEPENDENT_AMBULATORY_CARE_PROVIDER_SITE_OTHER): Payer: 59 | Admitting: Orthopaedic Surgery

## 2021-12-05 DIAGNOSIS — F330 Major depressive disorder, recurrent, mild: Secondary | ICD-10-CM | POA: Diagnosis not present

## 2021-12-05 DIAGNOSIS — Z79899 Other long term (current) drug therapy: Secondary | ICD-10-CM | POA: Diagnosis not present

## 2021-12-05 DIAGNOSIS — F411 Generalized anxiety disorder: Secondary | ICD-10-CM | POA: Diagnosis not present

## 2021-12-05 DIAGNOSIS — Z6832 Body mass index (BMI) 32.0-32.9, adult: Secondary | ICD-10-CM | POA: Diagnosis not present

## 2021-12-05 DIAGNOSIS — F1120 Opioid dependence, uncomplicated: Secondary | ICD-10-CM | POA: Diagnosis not present

## 2022-01-02 DIAGNOSIS — F411 Generalized anxiety disorder: Secondary | ICD-10-CM | POA: Diagnosis not present

## 2022-01-02 DIAGNOSIS — F330 Major depressive disorder, recurrent, mild: Secondary | ICD-10-CM | POA: Diagnosis not present

## 2022-01-02 DIAGNOSIS — F1120 Opioid dependence, uncomplicated: Secondary | ICD-10-CM | POA: Diagnosis not present

## 2022-01-25 ENCOUNTER — Other Ambulatory Visit (INDEPENDENT_AMBULATORY_CARE_PROVIDER_SITE_OTHER): Payer: Self-pay | Admitting: Sports Medicine

## 2022-01-25 DIAGNOSIS — Z0000 Encounter for general adult medical examination without abnormal findings: Secondary | ICD-10-CM

## 2022-01-25 DIAGNOSIS — E559 Vitamin D deficiency, unspecified: Secondary | ICD-10-CM

## 2022-01-30 DIAGNOSIS — E559 Vitamin D deficiency, unspecified: Secondary | ICD-10-CM

## 2022-01-30 DIAGNOSIS — Z0000 Encounter for general adult medical examination without abnormal findings: Secondary | ICD-10-CM

## 2022-02-05 LAB — CBC AND DIFFERENTIAL
Absolute NRBC: 0 10*3/uL (ref 0.00–0.00)
Basophils Absolute Automated: 0.08 10*3/uL (ref 0.00–0.08)
Basophils Automated: 1.9 %
Eosinophils Absolute Automated: 0.16 10*3/uL (ref 0.00–0.44)
Eosinophils Automated: 3.8 %
Hematocrit: 48.4 % (ref 37.6–49.6)
Hgb: 16.6 g/dL (ref 12.5–17.1)
Immature Granulocytes Absolute: 0.01 10*3/uL (ref 0.00–0.07)
Immature Granulocytes: 0.2 %
Instrument Absolute Neutrophil Count: 2.26 10*3/uL (ref 1.10–6.33)
Lymphocytes Absolute Automated: 1.37 10*3/uL (ref 0.42–3.22)
Lymphocytes Automated: 32.5 %
MCH: 30.5 pg (ref 25.1–33.5)
MCHC: 34.3 g/dL (ref 31.5–35.8)
MCV: 88.8 fL (ref 78.0–96.0)
MPV: 10.4 fL (ref 8.9–12.5)
Monocytes Absolute Automated: 0.34 10*3/uL (ref 0.21–0.85)
Monocytes: 8.1 %
Neutrophils Absolute: 2.26 10*3/uL (ref 1.10–6.33)
Neutrophils: 53.5 %
Nucleated RBC: 0 /100 WBC (ref 0.0–0.0)
Platelets: 167 10*3/uL (ref 142–346)
RBC: 5.45 10*6/uL (ref 4.20–5.90)
RDW: 12 % (ref 11–15)
WBC: 4.22 10*3/uL (ref 3.10–9.50)

## 2022-02-05 LAB — COMPREHENSIVE METABOLIC PANEL
ALT: 27 U/L (ref 0–55)
AST (SGOT): 27 U/L (ref 5–41)
Albumin/Globulin Ratio: 2 (ref 0.9–2.2)
Albumin: 4.7 g/dL (ref 3.5–5.0)
Alkaline Phosphatase: 51 U/L (ref 37–117)
Anion Gap: 7 (ref 5.0–15.0)
BUN: 17 mg/dL (ref 9.0–28.0)
Bilirubin, Total: 1.5 mg/dL — ABNORMAL HIGH (ref 0.2–1.2)
CO2: 31 mEq/L — ABNORMAL HIGH (ref 17–29)
Calcium: 9.7 mg/dL (ref 8.5–10.5)
Chloride: 105 mEq/L (ref 99–111)
Creatinine: 1 mg/dL (ref 0.5–1.5)
Globulin: 2.4 g/dL (ref 2.0–3.6)
Glucose: 97 mg/dL (ref 70–100)
Potassium: 5 mEq/L (ref 3.5–5.3)
Protein, Total: 7.1 g/dL (ref 6.0–8.3)
Sodium: 143 mEq/L (ref 135–145)
eGFR: >60 mL/min/{1.73_m2} (ref >=60)

## 2022-02-05 LAB — LIPID PANEL
Cholesterol / HDL Ratio: 2.9 Index
Cholesterol: 168 mg/dL (ref 0–199)
HDL: 58 mg/dL (ref 40–9999)
LDL Calculated: 97 mg/dL (ref 0–99)
Triglycerides: 63 mg/dL (ref 34–149)
VLDL Calculated: 13 mg/dL (ref 10–40)

## 2022-02-05 LAB — THYROID STIMULATING HORMONE (TSH) WITH REFLEX TO FREE T4: TSH, Abn Reflex to Free T4, Serum: 1.08 u[IU]/mL (ref 0.35–4.94)

## 2022-02-05 LAB — HEMOLYSIS INDEX(SOFT): Hemolysis Index: 12 Index (ref 0–24)

## 2022-02-05 LAB — VITAMIN D, 25 OH, TOTAL: Vitamin D, 25 OH, Total: 29 ng/mL — ABNORMAL LOW (ref 30–100)

## 2022-02-10 ENCOUNTER — Other Ambulatory Visit (INDEPENDENT_AMBULATORY_CARE_PROVIDER_SITE_OTHER): Payer: Self-pay | Admitting: Advanced Heart Failure and Transplant Cardiology

## 2022-02-10 DIAGNOSIS — Z136 Encounter for screening for cardiovascular disorders: Secondary | ICD-10-CM

## 2022-03-12 ENCOUNTER — Other Ambulatory Visit (INDEPENDENT_AMBULATORY_CARE_PROVIDER_SITE_OTHER): Payer: Self-pay

## 2022-03-12 DIAGNOSIS — Z136 Encounter for screening for cardiovascular disorders: Secondary | ICD-10-CM

## 2022-03-15 ENCOUNTER — Other Ambulatory Visit (INDEPENDENT_AMBULATORY_CARE_PROVIDER_SITE_OTHER): Payer: Self-pay | Admitting: Sports Medicine

## 2022-03-15 DIAGNOSIS — E559 Vitamin D deficiency, unspecified: Secondary | ICD-10-CM

## 2022-03-15 MED ORDER — ERGOCALCIFEROL 1.25 MG (50000 UT) PO CAPS
50000.0000 [IU] | ORAL_CAPSULE | ORAL | 0 refills | Status: AC
Start: 2022-03-15 — End: ?

## 2022-03-15 NOTE — Progress Notes (Signed)
Vit d deficient despite prior treatment with high dose ergocalciferol. Will supplement for longer course.

## 2022-04-03 ENCOUNTER — Encounter (INDEPENDENT_AMBULATORY_CARE_PROVIDER_SITE_OTHER): Payer: Self-pay | Admitting: Sports Medicine

## 2022-04-03 NOTE — Progress Notes (Signed)
S: Patient describes itching around his anus for about 1 year.  Symptoms are intermittent and worse with frequent wiping. Denies any herniation, pain with BM, or other evidence of hemorrhoid. He does report some blood in the area, but reports this is only when he wipes too hard.  He denies any blood in his stool, constipation, diarrhea, changes in bowel habits, abdominal pain, fevers, chills, weight loss, other systemic symptoms.  The itching is his only real complaint.  He denies any nocturnal component to his symptoms.  He does have a young daughter at home and has tried the [place] tape test at home which was negative.    O:  WDWN, NAD  Abd: soft, nt, nd, nabs  The anus is normal appearing without erythema, rash, herniation, / external hemorrhoid noted    A/P:  Pruritus ani  -avoid frequent wiping  -limit wiping and use moist toilette when possible  -consider bidet  -Calmoseptine ointment  -twice daily hibiclens cleanse when possible  -if no improvement, will refer to GI     Christian Rosebush, MD CAQSM   Board Certified, Fellowship Trained, Sports Medicine Physician   [place]

## 2022-04-23 ENCOUNTER — Other Ambulatory Visit: Payer: Self-pay | Admitting: Orthopaedic Surgery

## 2022-04-23 ENCOUNTER — Ambulatory Visit (INDEPENDENT_AMBULATORY_CARE_PROVIDER_SITE_OTHER): Payer: 59 | Admitting: Orthopaedic Surgery

## 2022-04-23 ENCOUNTER — Encounter (INDEPENDENT_AMBULATORY_CARE_PROVIDER_SITE_OTHER): Payer: Self-pay | Admitting: Orthopaedic Surgery

## 2022-04-23 ENCOUNTER — Ambulatory Visit
Admission: RE | Admit: 2022-04-23 | Discharge: 2022-04-23 | Disposition: A | Discharge status: Home / Self Care | Payer: 59 | Source: Ambulatory Visit | Attending: Orthopaedic Surgery | Admitting: Orthopaedic Surgery

## 2022-04-23 DIAGNOSIS — S39011A Strain of muscle, fascia and tendon of abdomen, initial encounter: Secondary | ICD-10-CM

## 2022-04-23 NOTE — Progress Notes (Signed)
[facility]    Provider: Marjory Sneddon, MD  Date of Exam:  04/23/2022   Patient:  Christian Vargas  DOB:  [birth year]    AGE:  32 y.o.  MR#:  [id number]     Chief Complaint: Left oblique pain.    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  Has been training for his recovery from his right shoulder rotator cuff repair and he noted similar mild discomfort in the same area.  He is concerned and wants to have it reevaluated.  He denies pain or discomfort with Valsalva maneuvers.  For other past medical history, social history, family history and past surgical history please see them listed below.    Club/School Affiliation: [employer]    Problem List: There is no problem list on file for this patient.       Past Medical History:  No past medical history on file.    Social History:      Family History: No family history on file.    Past Surgical History:  No past surgical history on file.    Medications: Scheduled Meds:  Continuous Infusions:  PRN Meds:.    Current Outpatient Medications:     celecoxib (CeleBREX) 100 MG capsule, Take 1 capsule (100 mg) by mouth 2 (two) times daily, Disp: 30 capsule, Rfl: 0    ergocalciferol (ERGOCALCIFEROL) 1.25 MG (50000 UT) capsule, Take 1 capsule (50,000 Units) by mouth once a week Take with fatty meal., Disp: 30 capsule, Rfl: 0     Allergies:  No Known Allergies    ROS:  Constitutional: No fatigue, fever, weight loss, or weight gain.   Ears, Nose, Mouth & Throat: No sore throat or hearing loss.   Cardiovascular: No chest pain, blood clots, or leg cramps.   Respiratory: No shortness of breath, cough, or difficulty breathing.   Gastrointestinal: No nausea, vomiting, diarrhea, or loss of appetite.   Genitourinary: No polyuria or kidney disease.   Musculoskeletal: No joint aches, muscle weakness or swelling of joints/body parts other than that mentioned above.  Integumentary: No finger nail changes or skin  dryness.   Neurological: No numbness, burning discomfort, or headaches.   Psychiatric: No depression or anxiety.   Endocrine: No increased thirst, change in appetite or thyroid disease.   Hematologic/Lymphatic: No easy bruising or anemia.     Exam: Left oblique tenderness palpation just on the lateral border of the rectus and the anterior margin of the external oblique  No palpable defect  The patient takes a deep inspiration he has tenderness to deep palpation in the area.  No pain with resisted sit up.    Vitals: There were no vitals taken for this visit.    General: Denise was awake, alert, oriented, easily engaged, displayed logical thinking with clear speech and was neat in appearance. His general appearance was normal, well-developed and well-nourished.    Gait: The patient demonstrated non antalgic gait with intact coordination and balance.     Studies: Ultrasound was performed by my partner Dr. Daron Offer Page.    Assessment/Plan: Left oblique strain.  Ultrasound showed may be an acute on chronic problem there therefore working to order an MRI and determine necessary treatment.  Of rest after the MRI.  We did discuss the possibility of a PRP injection to help with healing if necessary.     30 minutes were spent on this encounter, either face-to-face  with the patient and/or on the highlighted activities listed below:    Preparing for the visit (such as reviewing tests, notes, x-rays)  Getting and/or reviewing a history that was separately obtained  Performing the exam  Counseling and providing education to the patient, family, or caregiver  Documenting information in the medical record  Interpreting results and sharing that information with the patient, family or caregiver  Ordering medicines, tests, or procedures  Communicating with other healthcare professionals  Care coordination           The review of the patient's medications does not in any way constitute an endorsement, by this clinician,  of their use,  dosage, indications, route, efficacy, interactions, or other clinical parameters.    This note may have been generated in part or in whole within the [facility] EMR using Dragon medical speech recognition software and may contain inherent errors or omissions not intended by the user. Grammatical and punctuation errors, random word insertions, deletions, pronoun errors and incomplete sentences are occasional consequences of this technology due to software limitations. Not all errors are caught or corrected.  Although every attempt is made to root out erroneus and incomplete transcription, the note may still not fully represent the intent or opinion of the author. If there are questions or concerns about the content of this note or information contained within the body of this dictation they should be addressed directly with the author for clarification.

## 2022-04-24 ENCOUNTER — Encounter (INDEPENDENT_AMBULATORY_CARE_PROVIDER_SITE_OTHER): Payer: Self-pay

## 2022-04-24 NOTE — Progress Notes (Signed)
Per e-mail request of [employer]'s head athletic trainer Glenetta Hew, Dr. Salli Quarry clinic note from 04/23/2022 was e-mailed via [place] Confidential to [email or web address].

## 2022-06-20 ENCOUNTER — Telehealth (INDEPENDENT_AMBULATORY_CARE_PROVIDER_SITE_OTHER): Payer: Self-pay

## 2022-06-20 NOTE — Telephone Encounter (Signed)
Per Vic Ripper @ [employer], peer-to-peer scheduled on 06/25/2022 @ 12:45 PM regarding MRI denial.    CASE NO.: [id number]

## 2022-06-26 ENCOUNTER — Ambulatory Visit (INDEPENDENT_AMBULATORY_CARE_PROVIDER_SITE_OTHER): Payer: 59 | Admitting: Orthopaedic Surgery

## 2022-06-26 ENCOUNTER — Ambulatory Visit (INDEPENDENT_AMBULATORY_CARE_PROVIDER_SITE_OTHER): Payer: 59

## 2022-06-26 ENCOUNTER — Encounter (INDEPENDENT_AMBULATORY_CARE_PROVIDER_SITE_OTHER): Payer: Self-pay | Admitting: Orthopaedic Surgery

## 2022-06-26 VITALS — BP 117/77 | HR 74

## 2022-06-26 DIAGNOSIS — S6991XA Unspecified injury of right wrist, hand and finger(s), initial encounter: Secondary | ICD-10-CM

## 2022-06-27 ENCOUNTER — Encounter (INDEPENDENT_AMBULATORY_CARE_PROVIDER_SITE_OTHER): Payer: Self-pay | Admitting: Orthopaedic Surgery

## 2022-06-27 NOTE — Progress Notes (Signed)
Christian Vargas    Provider: Marjory Sneddon, MD  Date of Exam:  06/26/2022   Patient:  Christian Vargas  DOB:  November 17, 1990    AGE:  32 y.o.  MR#:  [id number]     Chief Complaint: Right DIPJ pain.    HPI:  Christian Vargas is a pleasant 32 y.o.-year-old male who presents today with a history of right thumb pain that he has had for the last several hours when he jammed his thumb while attending.  Christian Vargas noted.. he rates the pain at a 1/10 on today's visit. He localizes the pain to the thumb DIPJ.  His past surgical history significant for prior UCL repair reconstruction of the right thumb.  He denies any pain or instability in this area and recalls what that injury felt like.    For other past medical history, social history, family history and past surgical history please see them listed below.    Club/School Affiliation: [employer]    Problem List: There is no problem list on file for this patient.       Past Medical History:  History reviewed. No pertinent past medical history.    Social History:   Social History     Tobacco Use    Smoking status: Never       Family History: History reviewed. No pertinent family history.    Past Surgical History:  History reviewed. No pertinent surgical history.    Medications: Scheduled Meds:  Continuous Infusions:  PRN Meds:.    Current Outpatient Medications:     celecoxib (CeleBREX) 100 MG capsule, Take 1 capsule (100 mg) by mouth 2 (two) times daily (Patient not taking: Reported on 06/26/2022), Disp: 30 capsule, Rfl: 0    ergocalciferol (ERGOCALCIFEROL) 1.25 MG (50000 UT) capsule, Take 1 capsule (50,000 Units) by mouth once a week Take with fatty meal. (Patient not taking: Reported on 06/26/2022), Disp: 30 capsule, Rfl: 0     Allergies:  No Known Allergies    ROS:  Constitutional: No fatigue, fever, weight loss, or weight gain.   Ears, Nose, Mouth & Throat: No sore throat or hearing loss.   Cardiovascular: No chest pain, blood clots, or leg cramps.   Respiratory: No shortness of breath,  cough, or difficulty breathing.   Gastrointestinal: No nausea, vomiting, diarrhea, or loss of appetite.   Genitourinary: No polyuria or kidney disease.   Musculoskeletal: No joint aches, muscle weakness or swelling of joints/body parts other than that mentioned above.  Integumentary: No finger nail changes or skin dryness.   Neurological: No numbness, burning discomfort, or headaches.   Psychiatric: No depression or anxiety.   Endocrine: No increased thirst, change in appetite or thyroid disease.   Hematologic/Lymphatic: No easy bruising or anemia.     Exam: Left hand exam demonstrates evidence of some mild swelling and tenderness palpation of the thumb DIPJ.  He has full flexion and extension of the DIP as well as the PIP joint.  There is no radial or ulnar instability of the digit.    Vitals: BP 117/77   Pulse 74     General: Christian Vargas was awake, alert, oriented, easily engaged, displayed logical thinking with clear speech and was neat in appearance. His general appearance was normal, well-developed and well-nourished.    Studies: Three-view x-rays of the right thumb demonstrate evidence of prior surgical hardware and some mild degenerative changes of the PIPJ.  There is additionally there is some moderate degenerative changes of the DIPJ.  There  is no acute osseous abnormality no fracture subluxation or dislocation.      Assessment/Plan: Right thumb DIPJ sprain.  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  He can take over-the-counter anti-inflammatories as needed.  He understands that immobilization can make this joint stiff therefore early mobilization we have the best chance to have full return of function.     30 minutes were spent on this encounter, either face-to-face with the patient and/or on the highlighted activities listed below:    Preparing for the visit (such as reviewing tests, notes, x-rays)  Getting  and/or reviewing a history that was separately obtained  Performing the exam  Counseling and providing education to the patient, family, or caregiver  Documenting information in the medical record  Interpreting results and sharing that information with the patient, family or caregiver  Ordering medicines, tests, or procedures  Communicating with other healthcare professionals  Care coordination           The review of the patient's medications does not in any way constitute an endorsement, by this clinician,  of their use, dosage, indications, route, efficacy, interactions, or other clinical parameters.    This note may have been generated in part or in whole within the EPIC EMR using Dragon medical speech recognition software and may contain inherent errors or omissions not intended by the user. Grammatical and punctuation errors, random word insertions, deletions, pronoun errors and incomplete sentences are occasional consequences of this technology due to software limitations. Not all errors are caught or corrected.  Although every attempt is made to root out erroneus and incomplete transcription, the note may still not fully represent the intent or opinion of the author. If there are questions or concerns about the content of this note or information contained within the body of this dictation they should be addressed directly with the author for clarification.

## 2022-07-05 ENCOUNTER — Encounter (INDEPENDENT_AMBULATORY_CARE_PROVIDER_SITE_OTHER): Payer: Self-pay

## 2022-07-05 NOTE — Progress Notes (Signed)
Per e-mail request of  [employer] head athletic trainer Glenetta Hew, Dr. Salli Quarry clinic note from 06/26/2022 was e-mailed via [place] [employer] to [email or web address].

## 2022-07-24 ENCOUNTER — Encounter (INDEPENDENT_AMBULATORY_CARE_PROVIDER_SITE_OTHER): Payer: Self-pay | Admitting: Urology

## 2022-07-24 ENCOUNTER — Ambulatory Visit (INDEPENDENT_AMBULATORY_CARE_PROVIDER_SITE_OTHER): Payer: 59 | Admitting: Urology

## 2022-07-24 VITALS — BP 128/77 | HR 69 | Ht 75.2 in | Wt 195.0 lb

## 2022-07-24 DIAGNOSIS — I861 Scrotal varices: Secondary | ICD-10-CM

## 2022-07-24 DIAGNOSIS — N503 Cyst of epididymis: Secondary | ICD-10-CM

## 2022-07-24 LAB — URINALYSIS POCT
Urine Bilirubin POCT: NEGATIVE
Urine Blood POCT: NEGATIVE
Urine Glucose POCT: NEGATIVE mg/dL
Urine Ketones POCT: NEGATIVE mg/dL
Urine Leukocyte Esterase POCT: NEGATIVE
Urine Nitrites POCT: NEGATIVE
Urine Protein POCT: NEGATIVE mg/dL
Urine Specific Gravity POCT: 1.015
Urine Urobilinogen POCT: 0.2 mg/dL (ref 0.2–2.0)
Urine pH POCT: 6 (ref 5.0–8.0)

## 2022-07-24 NOTE — Patient Instructions (Signed)
On physical exam he does have a left grade 1 varicocele that is not symptomatic and I do not think is any clinical implications.    He does have what I suspect is a right epididymal cyst on exam about 1.5 cm in size.  He reports this is stable over the course of the last 2 years.  I have recommended a scrotal ultrasound to confirm this is a cyst.  If it is a simple cyst and he does not have any pain then no further follow-up or intervention is needed.

## 2022-07-24 NOTE — Progress Notes (Signed)
Subjective:      Patient ID: Christian Vargas is a 32 y.o. male     Chief Complaint:     Trying to get life insurance and was told he needs a follow up appt from seeing a urologist in [place] in 2022.     In 2022 had scrotal ultrasound for what he reports is a varicocele but he showed me a note from urologist describing a epididymal cyst. He reports testicular tumor markers were drawn and were normal but I do not have a report of those labs.    No testicle pain.  Never had inguinal surgery.    Has one child and does not want any more kids. No trouble with fertility.          The following portions of the patient's history were reviewed and updated as appropriate: allergies, current medications, past family history, past medical history, past social history, past surgical history and problem list.    Review of Systems  Systems reviewed per the HPI and below:     History obtained from the patient     General ROS: no fevers, or chills     Gastrointestinal ROS: no abdominal pain, change in bowel habits     Musculoskeletal ROS:  no swelling to lower extremities, no back pain     Objective:   BP 128/77 (BP Site: Right arm, Patient Position: Sitting, Cuff Size: Medium)   Pulse 69   Ht 1.91 m (6' 3.2")   Wt 88.5 kg (195 lb)   BMI 24.25 kg/m    vital signs reviewed  Physical Exam   Constitutional: Pt is oriented to person, place, and time and well-developed, well-nourished, and in no distress.   Pulmonary/Chest: Effort normal.   Abdominal: Soft. Pt exhibits no distension and no mass. There is no tenderness. There is no rebound and no guarding.   GU: testicles normal size bilateral- gr 1 left varicocele  Right epididymal tail has about 2 cm mass, not tender.          Lab Review   Reviewed results as listed below. Discussed findings with patient.     Results       Procedure Component Value Units Date/Time    Urinalysis POCT [[id number]] Collected: 07/24/22 1446    Specimen: Urine, Clean Catch Updated: 07/24/22 1446     Urine  Color POCT Yellow     Urine Clarity POCT Clear     Urine pH POCT 6.0     Urine Leukocyte Esterase POCT Negative     Urine Nitrites POCT Negative     Urine Protein POCT Negative mg/dL      Urine Glucose POCT Negative mg/dL      Urine Ketones POCT Negative mg/dL      Urine Urobilinogen POCT 0.2 mg/dL      Urine Bilirubin POCT Negative     Urine Blood POCT Negative     Urine Specific Gravity POCT 1.015              Radiology Review   Reviewed results as listed below. Discussed results with the patient and answered questions to the best of my ability.         Assessment:     1. Epididymal cyst    2. Left varicocele             Plan:     Patient Instructions   On physical exam he does have a left grade 1 varicocele that is not symptomatic  and I do not think is any clinical implications.    He does have what I suspect is a right epididymal cyst on exam about 1.5 cm in size.  He reports this is stable over the course of the last 2 years.  I have recommended a scrotal ultrasound to confirm this is a cyst.  If it is a simple cyst and he does not have any pain then no further follow-up or intervention is needed.      Orders  Orders Placed This Encounter   Procedures    US Scrotum and Testicles w Doppler Ltd     Standing Status:   Future     Standing Expiration Date:   07/24/2023     Order Specific Question:   Reason for Exam:     Answer:   testicle or epididymal mass     Order Specific Question:   Release to patient     Answer:   Immediate       This note was generated by the Epic EMR system/ Dragon speech recognition and may contain inherent errors or omissions not intended by the user. Grammatical errors, random word insertions, deletions, pronoun errors and incomplete sentences are occasional consequences of this technology due to software limitations. Not all errors are caught or corrected. If there are questions or concerns about the content of this note or information contained within the body of this dictation they  should be addressed directly with the author for clarification

## 2022-08-02 ENCOUNTER — Ambulatory Visit
Payer: Commercial Managed Care - HMO | Attending: Advanced Heart Failure and Transplant Cardiology | Admitting: Advanced Heart Failure and Transplant Cardiology

## 2022-08-02 ENCOUNTER — Other Ambulatory Visit (INDEPENDENT_AMBULATORY_CARE_PROVIDER_SITE_OTHER): Payer: Self-pay | Admitting: Urology

## 2022-08-02 VITALS — BP 131/71 | HR 57 | Temp 98.3°F | Ht 76.0 in | Wt 196.0 lb

## 2022-08-02 DIAGNOSIS — Q231 Congenital insufficiency of aortic valve: Secondary | ICD-10-CM | POA: Insufficient documentation

## 2022-08-02 NOTE — Progress Notes (Signed)
 [facility]    I had the pleasure of seeing Christian Vargas today in the [facility].    As you know, Mr. Gervasi is a 32 y.o. male with history of bicuspid aortic valve based on review of the previous records from [place] /epic.    Referring Physician: Dr. Maude Mayer (PCP)    Historical Context: Patient is a 32 year old male with a history of bicuspid aortic valve without stenosis or regurgitation presented for initial sports reevaluation on 08/02/2022.  The bicuspid valve was caught on a screening echocardiogram as part of protocol for soccer.    Chief Complaint: Life insurance physical    Today, he reports feeling well.  The patient denies any chest pain, chest pressure, shortness of breath, PND, orthopnea, presyncope or syncope.  No palpitations.  He is able to exercise at a high level he continues to play professional soccer palpitations.    All other systems were reviewed and are otherwise negative except as noted in the HPI.    Problem List[1]  Current Medications[2]    He has No Known Allergies.     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  No other bicuspid aortic valve    Upon physical examination today the vitals were BP 131/71   Pulse (!) 57   Temp 98.3 F (36.8 C) (Oral)   Ht 1.93 m (6' 4)   Wt 88.9 kg (196 lb)   SpO2 99%   BMI 23.86 kg/m  Body mass index is 23.86 kg/m.     The patient is well-developed, well-nourished, comfortable, and of normal body habitus.    Head and neck exam is notable for JVP ~6 cmH2O, there is no thyromegaly, nor carotid bruits.  The patient has fair dentition.    Lungs were clear to auscultation bilaterally without evidence of crackles, wheezes or rhonchi.    Cardiovascular exam demonstrated bradycardic rate, regular rhythm, normal S1 and S2, no S3 nor S4, no  murmur, no rubs, and with a non- displaced PMI.    Abdomen was soft, nontender and nondistended with normal active bowel sounds and no evidence of hepatosplenomegaly, nor other masses.   Extremities were warm and well perfused with 2+ distal pulses throughout and no evidence of clubbing, no cyanosis. There was no lower extremity edema.   Neurological exam was nonfocal, pupils were equal and reactive, no facial droop.    The patient's mood was euthymic, and affect was normal.    Lab Results   Component Value Date    WBC 4.22 02/05/2022    HGB 16.6 02/05/2022    PLT 167 02/05/2022    NA 143 02/05/2022    K 5.0 02/05/2022    BUN 17.0 02/05/2022    CREAT 1.0 02/05/2022    CA 9.7 02/05/2022    GLU 97 02/05/2022    AST 27 02/05/2022    ALT 27 02/05/2022    ALB 4.7 02/05/2022    TSH 1.08 02/05/2022    T4 6.6 01/20/2021    LDL 97 02/05/2022    TRIG 63 02/05/2022    IRON 121 01/20/2021     Review of available cardiovascular studies included:   #.  ECG (01/29/2022): Sinus bradycardia, normal ECG    #.  Echocardiogram (12/16/2019): LVEF 55 to 60%, normal left ventricular size chamber size, wall thickness, and systolic function.  Normal diastolic function.  Images  normal right ventricular size and systolic function.  Aortic valve is thickened and bicuspid.  There is trace aortic regurgitation.  Appointment no aortic stenosis.  Normal sinus of Valsalva, sinotubular junction, and ascending root diameter.    In summary,  Mr. Amoni Morales is a 32 y.o. male. At this time, we make the following recommendations:    #.  Bicuspid aortic valve  -No evidence of regurgitation or stenosis on physical exam today.  Requires every 5 year echocardiograms for tracking.  No aortic root dilatation on previous imaging.  -Recommended screening of first-degree relatives  -No limitations to activity.  Patient can continue to exercise at high level and compete in professional sports    #. Follow up: Annually as needed    I spent 38 minutes of  face-to-face time with the patient.    It is a pleasure to participate in the care of Christian Vargas. If you have any questions or concerns, please feel free to contact me at [phone].    CHRISTELLA Augustin Parker MD  Director, Sports Cardiology  [facility]  [address]  [place], [place]  [address]  [phone] (desk)  [phone] (fax)       [1] There is no problem list on file for this patient.  [2]   Current Outpatient Medications   Medication Sig Dispense Refill    celecoxib  (CeleBREX ) 100 MG capsule Take 1 capsule (100 mg) by mouth 2 (two) times daily (Patient not taking: Reported on 08/02/2022) 30 capsule 0    ergocalciferol  (ERGOCALCIFEROL ) 1.25 MG (50000 UT) capsule Take 1 capsule (50,000 Units) by mouth once a week Take with fatty meal. (Patient not taking: Reported on 08/02/2022) 30 capsule 0     No current facility-administered medications for this visit.

## 2022-08-02 NOTE — Patient Instructions (Signed)
 Changes for Today 08/01/2021:    No changes to current plan  Take your blood pressure, heart rate, and weight first thing every morning. Keep a log of those numbers.    Return to the clinic in 1 year with Dr. Allena for a follow-up appointment.      Keep us  updated via the online MyChart portal. This is the best way to contact us .    Reach out if you have a change in vital signs or start experiencing new or worsening heart failure symptoms: chest pain/pressure, shortness of breath/difficulty breathing, lightheadedness, dizziness, decreased appetite, abdominal bloating, swelling in your legs/ankles/feet, weight gain  You can expect to get a response within 24-48 hours during weekdays - if you do not, resend your request or send an e-mail. We try to answer every question as soon as possible. Average response for a phone call is 3 days due to volume (which is why MyChart is preferred).  *MyChart should not be used if you are having a medical emergency -- call 911*    My information: Christian Vargas, Heart Failure RN - [email or web address] // [phone]    Or e-mail/call one of the other Advanced Heart Failure nurses below:     - McKenzie JasonBETHA [email or web address] // [phone]   - Reche Prescott, RN: [phone] // [email or web address]  - Laymon Corin, RN: [phone] // [email or web address]  - Rocky Land, RN: [email or web address] //    if you are under evaluation/on the wait list for Heart Transplant/LVAD, please contact one of the Heart Transplant Coordinators below:    - Alan Bower, RN: [phone] // [email or web address]  - Darleene Sanes, RN: [phone] // [email or web address]  - Juliene Overly, RN: [phone] // [email or web address]  - Rocky Austin, RN: [phone] // [email or web address]                Other helpful phone numbers:                - Front Desk: [phone] // Fax number: [phone]              - Central Scheduling (for all [facility] testing & procedures):  [phone]   - Cardiac Diagnostics: [phone]                [facility], Admin/Scheduler: [phone]              - Logan Geralds, Admin/Scheduler: [phone]   - Meade Sanders, Cardiogenetics Coordinator: [phone]               - Alfonso Filbert, Outreach Admin/Scheduler: [phone]              - Duwaine Rout, Social Worker: [phone] / [email or web address] [place] Vernell Rosaline, Dietitian: [phone] // [email or web address] [place] Elveria Zana, Dietitian: [phone] // [email or web address]    For medication refills: Call [phone], option #1 then option #3  -please allow 48 hours to prescription refills to be processed NO x

## 2022-08-06 ENCOUNTER — Telehealth (INDEPENDENT_AMBULATORY_CARE_PROVIDER_SITE_OTHER): Payer: Self-pay | Admitting: Urology

## 2022-08-06 NOTE — Telephone Encounter (Signed)
I saw patient last week for mass in right side of scrotum. He reports no change in mass over the last few years. 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 Follow up ultrasound was done 08/02/2022 that shows by maximal measurement the mass is actually a little smaller at 1.8 cm in maximum dimension. I do not think further intervention or follow up is needed unless the mass gets bigger, based on stable size over the last 2 years.  I left a message on patients cell phone and offered to set up video visit to discuss if he likes. I will also forward this to team physician Dr. Merril Abbe.    Impression of ultrasound from 2022:  Normal ultrasound of the testicles. No testicular mass.     Solid right epididymal tail nodule measuring 1.8 x 2.1 x 1.0 cm. Differential diagnosis includes benign adenomatoid tumor, epididymal leiomyoma or sperm granuloma. Urology referral recommended.         Dictated by Cato Mulligan @ 04/22/2020 7:47:49 PM     (Electronically Signed)       IMPRESSION of ultrasound from 2024:       1. There is a small solid mass in the tail of the right epididymis measuring  1.6 x 1.3 x 1.8 cm likely a adenomatoid nodule or sperm granuloma.  Comparison with prior imaging is recommended.    2. Normal testes bilaterally with normal arterial and venous flow.  3. Small left varicocele.     Electronically signed by: Colonel Bald M.D.  [facility]     KG: 08/02/22
